# Patient Record
Sex: Male | Born: 1950
Health system: Southern US, Community
[De-identification: ages and names within clinical notes are randomized; demographics above are authoritative.]

## PROBLEM LIST (undated history)

## (undated) DIAGNOSIS — I10 Essential (primary) hypertension: Secondary | ICD-10-CM

## (undated) DIAGNOSIS — E785 Hyperlipidemia, unspecified: Secondary | ICD-10-CM

## (undated) HISTORY — DX: Hyperlipidemia, unspecified: E78.5

## (undated) HISTORY — DX: Essential (primary) hypertension: I10

---

## 2011-10-10 ENCOUNTER — Ambulatory Visit: Payer: Worker's Compensation | Attending: Orthopedic Surgery | Admitting: Physical Therapy

## 2011-10-10 DIAGNOSIS — R262 Difficulty in walking, not elsewhere classified: Secondary | ICD-10-CM | POA: Insufficient documentation

## 2011-10-10 DIAGNOSIS — IMO0001 Reserved for inherently not codable concepts without codable children: Secondary | ICD-10-CM | POA: Insufficient documentation

## 2011-10-10 DIAGNOSIS — R5381 Other malaise: Secondary | ICD-10-CM | POA: Insufficient documentation

## 2011-10-10 DIAGNOSIS — M25579 Pain in unspecified ankle and joints of unspecified foot: Secondary | ICD-10-CM | POA: Insufficient documentation

## 2011-10-12 ENCOUNTER — Ambulatory Visit: Payer: Worker's Compensation | Admitting: *Deleted

## 2011-10-16 ENCOUNTER — Ambulatory Visit: Payer: Worker's Compensation | Admitting: Physical Therapy

## 2011-10-18 ENCOUNTER — Ambulatory Visit: Payer: Worker's Compensation | Admitting: Physical Therapy

## 2011-10-23 ENCOUNTER — Ambulatory Visit: Payer: Worker's Compensation | Admitting: Physical Therapy

## 2011-10-26 ENCOUNTER — Ambulatory Visit: Payer: Worker's Compensation | Admitting: Physical Therapy

## 2011-10-31 ENCOUNTER — Ambulatory Visit: Payer: Worker's Compensation | Attending: Orthopedic Surgery | Admitting: *Deleted

## 2011-10-31 DIAGNOSIS — R262 Difficulty in walking, not elsewhere classified: Secondary | ICD-10-CM | POA: Insufficient documentation

## 2011-10-31 DIAGNOSIS — R5381 Other malaise: Secondary | ICD-10-CM | POA: Insufficient documentation

## 2011-10-31 DIAGNOSIS — IMO0001 Reserved for inherently not codable concepts without codable children: Secondary | ICD-10-CM | POA: Insufficient documentation

## 2011-10-31 DIAGNOSIS — M25579 Pain in unspecified ankle and joints of unspecified foot: Secondary | ICD-10-CM | POA: Insufficient documentation

## 2011-11-02 ENCOUNTER — Ambulatory Visit: Payer: Worker's Compensation | Admitting: *Deleted

## 2011-11-07 ENCOUNTER — Ambulatory Visit: Payer: Worker's Compensation | Admitting: *Deleted

## 2011-11-09 ENCOUNTER — Ambulatory Visit: Payer: Worker's Compensation | Admitting: Physical Therapy

## 2011-11-14 ENCOUNTER — Ambulatory Visit: Payer: Worker's Compensation | Admitting: Physical Therapy

## 2011-11-16 ENCOUNTER — Ambulatory Visit: Payer: Worker's Compensation | Admitting: Physical Therapy

## 2012-05-14 ENCOUNTER — Encounter: Payer: Self-pay | Admitting: Internal Medicine

## 2012-06-20 ENCOUNTER — Encounter: Payer: Worker's Compensation | Admitting: Internal Medicine

## 2012-06-25 ENCOUNTER — Encounter: Payer: Worker's Compensation | Admitting: Internal Medicine

## 2013-03-14 ENCOUNTER — Other Ambulatory Visit: Payer: Self-pay | Admitting: Family Medicine

## 2013-03-17 NOTE — Telephone Encounter (Signed)
LAST OV 7/13. LAST REFILL 12/11/12 FOR #90.

## 2013-04-15 ENCOUNTER — Ambulatory Visit (INDEPENDENT_AMBULATORY_CARE_PROVIDER_SITE_OTHER): Payer: BC Managed Care – PPO | Admitting: Physician Assistant

## 2013-04-15 ENCOUNTER — Encounter: Payer: Self-pay | Admitting: Physician Assistant

## 2013-04-15 VITALS — BP 153/84 | HR 65 | Temp 98.4°F | Ht 68.0 in | Wt 171.0 lb

## 2013-04-15 DIAGNOSIS — E785 Hyperlipidemia, unspecified: Secondary | ICD-10-CM | POA: Insufficient documentation

## 2013-04-15 DIAGNOSIS — I10 Essential (primary) hypertension: Secondary | ICD-10-CM | POA: Insufficient documentation

## 2013-04-15 DIAGNOSIS — Z0000 Encounter for general adult medical examination without abnormal findings: Secondary | ICD-10-CM

## 2013-04-15 LAB — BASIC METABOLIC PANEL WITH GFR
Chloride: 104 mEq/L (ref 96–112)
Creat: 0.87 mg/dL (ref 0.50–1.35)
GFR, Est Non African American: >89 mL/min
Potassium: 3.8 mEq/L (ref 3.5–5.3)
Sodium: 140 mEq/L (ref 135–145)

## 2013-04-15 LAB — HEPATIC FUNCTION PANEL
Albumin: 4.5 g/dL (ref 3.5–5.2)
Alkaline Phosphatase: 73 U/L (ref 39–117)
Total Bilirubin: 0.3 mg/dL (ref 0.3–1.2)

## 2013-04-15 LAB — LIPID PANEL
LDL Cholesterol: 124 mg/dL — ABNORMAL HIGH (ref 0–99)
VLDL: 30 mg/dL (ref 0–40)

## 2013-04-15 MED ORDER — SIMVASTATIN 10 MG PO TABS: 10.0000 mg | ORAL_TABLET | Freq: Every day | ORAL | Status: DC

## 2013-04-15 MED ORDER — AMLODIPINE BESYLATE 10 MG PO TABS: 10.0000 mg | ORAL_TABLET | Freq: Every day | ORAL | Status: DC

## 2013-04-15 NOTE — Progress Notes (Signed)
Subjective:     Patient ID: Daniel Baxter, male   DOB: 03-26-1951, 62 y.o.   MRN: [id number]  Hypertension This is a chronic problem. The current episode started more than 1 year ago. The problem has been resolved since onset. The problem is controlled. Risk factors for coronary artery disease include dyslipidemia, family history, male gender, smoking/tobacco exposure and sedentary lifestyle. Past treatments include ACE inhibitors and diuretics. The current treatment provides significant improvement. Compliance problems include diet and exercise.   Hyperlipidemia This is a chronic problem. The current episode started more than 1 year ago. The problem is controlled. Recent lipid tests were reviewed and are variable. Factors aggravating his hyperlipidemia include fatty foods and smoking. Current antihyperlipidemic treatment includes statins. The current treatment provides mild improvement of lipids. Compliance problems include adherence to exercise and adherence to diet.  Risk factors for coronary artery disease include dyslipidemia, family history, hypertension, male sex, obesity and a sedentary lifestyle.     Review of Systems  All other systems reviewed and are negative.       Objective:   Physical Exam  Constitutional: He is oriented to person, place, and time. He appears well-developed and well-nourished.  HENT:  Head: Normocephalic and atraumatic.  Right Ear: External ear normal.  Left Ear: External ear normal.  Nose: Nose normal.  Mouth/Throat: Oropharynx is clear and moist.  Eyes: Conjunctivae and EOM are normal. Pupils are equal, round, and reactive to light.  Neck: Normal range of motion. Neck supple. No JVD present. No tracheal deviation present.  Cardiovascular: Normal rate, regular rhythm and normal heart sounds.   Pulmonary/Chest: Effort normal and breath sounds normal. No respiratory distress. He has no wheezes. He has no rales.  Abdominal: Soft. Bowel sounds are normal. He  exhibits no distension and no mass. There is no tenderness. There is no rebound and no guarding.  Genitourinary: Penis normal.  No hernia  Musculoskeletal: Normal range of motion. He exhibits no edema and no tenderness.  Lymphadenopathy:    He has no cervical adenopathy.  Neurological: He is alert and oriented to person, place, and time. He has normal reflexes. No cranial nerve deficit.  Skin: Skin is warm and dry.  Psychiatric: He has a normal mood and affect.       Assessment:     Complete  HTN Hyperlipid    Plan:     Cont current meds Pt defers colonoscopy Labs pending- will inform of lab results Stressed importance of diet F/U 6 mos

## 2013-04-15 NOTE — Patient Instructions (Signed)

## 2013-04-16 ENCOUNTER — Telehealth: Payer: Self-pay | Admitting: Family Medicine

## 2013-04-16 NOTE — Telephone Encounter (Signed)
CALLED BACK NEVER MIND DON'T UP IT HE HAS BEEN OFF OF IT FOR AWHILE HE JUST WANTS YOU TO SEND HIS RX IN TO THE PHARMACY

## 2013-07-10 ENCOUNTER — Other Ambulatory Visit: Payer: Self-pay | Admitting: Physician Assistant

## 2013-11-29 ENCOUNTER — Other Ambulatory Visit: Payer: Self-pay | Admitting: Physician Assistant

## 2013-12-02 NOTE — Telephone Encounter (Signed)
Last seen 5/14 [facility]

## 2014-01-03 ENCOUNTER — Other Ambulatory Visit: Payer: Self-pay | Admitting: Family Medicine

## 2014-01-05 ENCOUNTER — Encounter: Payer: BC Managed Care – PPO | Admitting: *Deleted

## 2014-01-05 NOTE — Progress Notes (Signed)
This encounter was created in error - please disregard. Patient came in for vomiting and diarrhea but this had stopped as of yesterday. He had spoke with Dr. Christell ConstantMoore yesterday morning and he advised him to eat jello, clear liquids and try zantac and this seemed to help. Patient advised that if the symptoms come back to call and we will get him but to continue eating a bland diet today and see how he does

## 2014-01-06 NOTE — Telephone Encounter (Signed)
Last seen 5/14  [facility]

## 2014-01-06 NOTE — Telephone Encounter (Signed)
Patient aware, rx for BP medication filled for one month but will need to schedule an appointment.

## 2014-01-06 NOTE — Telephone Encounter (Signed)
NTBS.

## 2014-02-04 ENCOUNTER — Other Ambulatory Visit: Payer: Self-pay | Admitting: Nurse Practitioner

## 2014-03-08 ENCOUNTER — Other Ambulatory Visit: Payer: Self-pay | Admitting: Family Medicine

## 2014-04-08 ENCOUNTER — Telehealth: Payer: Self-pay | Admitting: Family Medicine

## 2014-04-08 MED ORDER — AMLODIPINE BESYLATE 10 MG PO TABS: ORAL_TABLET | ORAL | Status: DC

## 2014-04-08 NOTE — Telephone Encounter (Signed)
appt given for 6/11 and we refilled his bp for 1 month until his follow up appt

## 2014-05-04 ENCOUNTER — Other Ambulatory Visit: Payer: Self-pay | Admitting: Physician Assistant

## 2014-05-07 ENCOUNTER — Ambulatory Visit (INDEPENDENT_AMBULATORY_CARE_PROVIDER_SITE_OTHER): Payer: BC Managed Care – PPO | Admitting: Family

## 2014-05-07 ENCOUNTER — Encounter: Payer: Self-pay | Admitting: Family

## 2014-05-07 VITALS — BP 164/81 | HR 69 | Temp 98.2°F | Ht 68.0 in | Wt 175.8 lb

## 2014-05-07 DIAGNOSIS — Z0000 Encounter for general adult medical examination without abnormal findings: Secondary | ICD-10-CM

## 2014-05-07 DIAGNOSIS — I10 Essential (primary) hypertension: Secondary | ICD-10-CM

## 2014-05-07 DIAGNOSIS — E785 Hyperlipidemia, unspecified: Secondary | ICD-10-CM

## 2014-05-07 DIAGNOSIS — R599 Enlarged lymph nodes, unspecified: Secondary | ICD-10-CM

## 2014-05-07 MED ORDER — AMLODIPINE BESYLATE 10 MG PO TABS: ORAL_TABLET | ORAL | Status: DC

## 2014-05-07 NOTE — Patient Instructions (Signed)
Health Maintenance, Males A healthy lifestyle and preventative care can promote health and wellness.  Maintain regular health, dental, and eye exams.  Eat a healthy diet. Foods like vegetables, fruits, whole grains, low-fat dairy products, and lean protein foods contain the nutrients you need and are low in calories. Decrease your intake of foods high in solid fats, added sugars, and salt. Get information about a proper diet from your health care provider, if necessary.  Regular physical exercise is one of the most important things you can do for your health. Most adults should get at least 150 minutes of moderate-intensity exercise (any activity that increases your heart rate and causes you to sweat) each week. In addition, most adults need muscle-strengthening exercises on 2 or more days a week.   Maintain a healthy weight. The body mass index (BMI) is a screening tool to identify possible weight problems. It provides an estimate of body fat based on height and weight. Your health care provider can find your BMI and can help you achieve or maintain a healthy weight. For males 20 years and older:  A BMI below 18.5 is considered underweight.  A BMI of 18.5 to 24.9 is normal.  A BMI of 25 to 29.9 is considered overweight.  A BMI of 30 and above is considered obese.  Maintain normal blood lipids and cholesterol by exercising and minimizing your intake of saturated fat. Eat a balanced diet with plenty of fruits and vegetables. Blood tests for lipids and cholesterol should begin at age 20 and be repeated every 5 years. If your lipid or cholesterol levels are high, you are over 50, or you are at high risk for heart disease, you may need your cholesterol levels checked more frequently.Ongoing high lipid and cholesterol levels should be treated with medicines, if diet and exercise are not working.  If you smoke, find out from your health care provider how to quit. If you do not use tobacco, do not  start.  Lung cancer screening is recommended for adults aged 55 80 years who are at high risk for developing lung cancer because of a history of smoking. A yearly low-dose CT scan of the lungs is recommended for people who have at least a 30-pack-year history of smoking and are a current smoker or have quit within the past 15 years. A pack year of smoking is smoking an average of 1 pack of cigarettes a day for 1 year (for example, a 30-pack-year history of smoking could mean smoking 1 pack a day for 30 years or 2 packs a day for 15 years). Yearly screening should continue until the smoker has stopped smoking for at least 15 years. Yearly screening should be stopped for people who develop a health problem that would prevent them from having lung cancer treatment.  If you choose to drink alcohol, do not have more than 2 drinks per day. One drink is considered to be 12 oz (360 mL) of beer, 5 oz (150 mL) of wine, or 1.5 oz (45 mL) of liquor.  Avoid use of street drugs. Do not share needles with anyone. Ask for help if you need support or instructions about stopping the use of drugs.  High blood pressure causes heart disease and increases the risk of stroke. Blood pressure should be checked at least every 1 2 years. Ongoing high blood pressure should be treated with medicines if weight loss and exercise are not effective.  If you are 45 61 years old, ask your health   care provider if you should take aspirin to prevent heart disease.  Diabetes screening involves taking a blood sample to check your fasting blood sugar level. This should be done once every 3 years after age 45, if you are at a normal weight and without risk factors for diabetes. Testing should be considered at a younger age or be carried out more frequently if you are overweight and have at least 1 risk factor for diabetes.  Colorectal cancer can be detected and often prevented. Most routine colorectal cancer screening begins at the age of 50  and continues through age 75. However, your health care provider may recommend screening at an earlier age if you have risk factors for colon cancer. On a yearly basis, your health care provider may provide home test kits to check for hidden blood in the stool. A small camera at the end of a tube may be used to directly examine the colon (sigmoidoscopy or colonoscopy) to detect the earliest forms of colorectal cancer. Talk to your health care provider about this at age 50, when routine screening begins. A direct exam of the colon should be repeated every 5 10 years through age 75, unless early forms of pre-cancerous polyps or small growths are found.  People who are at an increased risk for hepatitis B should be screened for this virus. You are considered at high risk for hepatitis B if:  You were born in a country where hepatitis B occurs often. Talk with your health care provider about which countries are considered high-risk.  Your parents were born in a high-risk country and you have not received a shot to protect against hepatitis B (hepatitis B vaccine).  You have HIV or AIDS.  You use needles to inject street drugs.  You live with, or have sex with, someone who has hepatitis B.  You are a man who has sex with other men (MSM).  You get hemodialysis treatment.  You take certain medicines for conditions like cancer, organ transplantation, and autoimmune conditions.  Hepatitis C blood testing is recommended for all people born from 1945 through 1965 and any individual with known risk factors for hepatitis C.  Healthy men should no longer receive prostate-specific antigen (PSA) blood tests as part of routine cancer screening. Talk to your health care provider about prostate cancer screening.  Testicular cancer screening is not recommended for adolescents or adult males who have no symptoms. Screening includes self-exam, a health care provider exam, and other screening tests. Consult with  your health care provider about any symptoms you have or any concerns you have about testicular cancer.  Practice safe sex. Use condoms and avoid high-risk sexual practices to reduce the spread of sexually transmitted infections (STIs).  Use sunscreen. Apply sunscreen liberally and repeatedly throughout the day. You should seek shade when your shadow is shorter than you. Protect yourself by wearing long sleeves, pants, a wide-brimmed hat, and sunglasses year round, whenever you are outdoors.  Tell your health care provider of new moles or changes in moles, especially if there is a change in shape or color. Also tell your provider if a mole is larger than the size of a pencil eraser.  A one-time screening for abdominal aortic aneurysm (AAA) and surgical repair of large AAAs by ultrasound is recommended for men aged 65 75 years who are current or former smokers.  Stay current with your vaccines (immunizations). Document Released: 05/11/2008 Document Revised: 09/03/2013 Document Reviewed: 04/10/2011 [employer] Patient Information 2014 [employer].   Fat and Cholesterol Control Diet Fat and cholesterol levels in your blood and organs are influenced by your diet. High levels of fat and cholesterol may lead to diseases of the heart, small and large blood vessels, gallbladder, liver, and pancreas. CONTROLLING FAT AND CHOLESTEROL WITH DIET Although exercise and lifestyle factors are important, your diet is key. That is because certain foods are known to raise cholesterol and others to lower it. The goal is to balance foods for their effect on cholesterol and more importantly, to replace saturated and trans fat with other types of fat, such as monounsaturated fat, polyunsaturated fat, and omega-3 fatty acids. On average, a person should consume no more than 15 to 17 g of saturated fat daily. Saturated and trans fats are considered "bad" fats, and they will raise LDL cholesterol. Saturated fats are primarily  found in animal products such as meats, butter, and cream. However, that does not mean you need to give up all your favorite foods. Today, there are good tasting, low-fat, low-cholesterol substitutes for most of the things you like to eat. Choose low-fat or nonfat alternatives. Choose round or loin cuts of red meat. These types of cuts are lowest in fat and cholesterol. Chicken (without the skin), fish, veal, and ground [place] breast are great choices. Eliminate fatty meats, such as hot dogs and salami. Even shellfish have little or no saturated fat. Have a 3 oz (85 g) portion when you eat lean meat, poultry, or fish. Trans fats are also called "partially hydrogenated oils." They are oils that have been scientifically manipulated so that they are solid at room temperature resulting in a longer shelf life and improved taste and texture of foods in which they are added. Trans fats are found in stick margarine, some tub margarines, cookies, crackers, and baked goods.  When baking and cooking, oils are a great substitute for butter. The monounsaturated oils are especially beneficial since it is believed they lower LDL and raise HDL. The oils you should avoid entirely are saturated tropical oils, such as coconut and palm.  Remember to eat a lot from food groups that are naturally free of saturated and trans fat, including fish, fruit, vegetables, beans, grains (barley, rice, couscous, bulgur wheat), and pasta (without cream sauces).  IDENTIFYING FOODS THAT LOWER FAT AND CHOLESTEROL  Soluble fiber may lower your cholesterol. This type of fiber is found in fruits such as apples, vegetables such as broccoli, potatoes, and carrots, legumes such as beans, peas, and lentils, and grains such as barley. Foods fortified with plant sterols (phytosterol) may also lower cholesterol. You should eat at least 2 g per day of these foods for a cholesterol lowering effect.  Read package labels to identify low-saturated fats, trans  fat free, and low-fat foods at the supermarket. Select cheeses that have only 2 to 3 g saturated fat per ounce. Use a heart-healthy tub margarine that is free of trans fats or partially hydrogenated oil. When buying baked goods (cookies, crackers), avoid partially hydrogenated oils. Breads and muffins should be made from whole grains (whole-wheat or whole oat flour, instead of "flour" or "enriched flour"). Buy non-creamy canned soups with reduced salt and no added fats.  FOOD PREPARATION TECHNIQUES  Never deep-fry. If you must fry, either stir-fry, which uses very little fat, or use non-stick cooking sprays. When possible, broil, bake, or roast meats, and steam vegetables. Instead of putting butter or margarine on vegetables, use lemon and herbs, applesauce, and cinnamon (for squash and sweet potatoes). Use  nonfat yogurt, salsa, and low-fat dressings for salads.  LOW-SATURATED FAT / LOW-FAT FOOD SUBSTITUTES Meats / Saturated Fat (g)  Avoid: Steak, marbled (3 oz/85 g) / 11 g  Choose: Steak, lean (3 oz/85 g) / 4 g  Avoid: Hamburger (3 oz/85 g) / 7 g  Choose: Hamburger, lean (3 oz/85 g) / 5 g  Avoid: Ham (3 oz/85 g) / 6 g  Choose: Ham, lean cut (3 oz/85 g) / 2.4 g  Avoid: Chicken, with skin, dark meat (3 oz/85 g) / 4 g  Choose: Chicken, skin removed, dark meat (3 oz/85 g) / 2 g  Avoid: Chicken, with skin, light meat (3 oz/85 g) / 2.5 g  Choose: Chicken, skin removed, light meat (3 oz/85 g) / 1 g Dairy / Saturated Fat (g)  Avoid: Whole milk (1 cup) / 5 g  Choose: Low-fat milk, 2% (1 cup) / 3 g  Choose: Low-fat milk, 1% (1 cup) / 1.5 g  Choose: Skim milk (1 cup) / 0.3 g  Avoid: Hard cheese (1 oz/28 g) / 6 g  Choose: Skim milk cheese (1 oz/28 g) / 2 to 3 g  Avoid: Cottage cheese, 4% fat (1 cup) / 6.5 g  Choose: Low-fat cottage cheese, 1% fat (1 cup) / 1.5 g  Avoid: Ice cream (1 cup) / 9 g  Choose: Sherbet (1 cup) / 2.5 g  Choose: Nonfat frozen yogurt (1 cup) / 0.3  g  Choose: Frozen fruit bar / trace  Avoid: Whipped cream (1 tbs) / 3.5 g  Choose: Nondairy whipped topping (1 tbs) / 1 g Condiments / Saturated Fat (g)  Avoid: Mayonnaise (1 tbs) / 2 g  Choose: Low-fat mayonnaise (1 tbs) / 1 g  Avoid: Butter (1 tbs) / 7 g  Choose: Extra light margarine (1 tbs) / 1 g  Avoid: Coconut oil (1 tbs) / 11.8 g  Choose: Olive oil (1 tbs) / 1.8 g  Choose: Corn oil (1 tbs) / 1.7 g  Choose: Safflower oil (1 tbs) / 1.2 g  Choose: Sunflower oil (1 tbs) / 1.4 g  Choose: Soybean oil (1 tbs) / 2.4 g  Choose: Canola oil (1 tbs) / 1 g Document Released: 11/13/2005 Document Revised: 03/10/2013 Document Reviewed: 05/04/2011 Exit[employer] Patient Information 2014 [place], [place].

## 2014-05-07 NOTE — Progress Notes (Signed)
Subjective:    Patient ID: Daniel Baxter, male    DOB: Jan 28, 1951, 63 y.o.   MRN: [id number]  Hypertension This is a chronic problem. The current episode started more than 1 year ago. The problem has been waxing and waning since onset. The problem is uncontrolled. Pertinent negatives include no anxiety, headaches, palpitations, peripheral edema or shortness of breath. Risk factors for coronary artery disease include dyslipidemia and male gender. Past treatments include calcium channel blockers. Improvement on treatment: Pt has not taken medication in over month, he "ran out" There is no history of kidney disease, CAD/MI or a thyroid problem. There is no history of sleep apnea.  Hyperlipidemia This is a chronic problem. The current episode started more than 1 year ago. The problem is uncontrolled. Recent lipid tests were reviewed and are high. Pertinent negatives include no leg pain or shortness of breath. Current antihyperlipidemic treatment includes statins (Pt stop taking medication a "long time ago"). The current treatment provides no improvement of lipids. Compliance problems include medication cost.  Risk factors for coronary artery disease include dyslipidemia, hypertension and male sex.    *Pt states he "ran out of my blood pressure medications a month ago and I stopped taking my other medication a long time ago".   Review of Systems  HENT: Negative.   Respiratory: Negative.  Negative for shortness of breath.   Cardiovascular: Negative.  Negative for palpitations.  Gastrointestinal: Negative.   Genitourinary: Negative.   Musculoskeletal: Negative.   Neurological: Negative for headaches.  Hematological: Negative.   Psychiatric/Behavioral: Negative.   All other systems reviewed and are negative.      Objective:   Physical Exam  Vitals reviewed. Constitutional: He is oriented to person, place, and time. He appears well-developed and well-nourished. No distress.  HENT:  Head:  Normocephalic.  Right Ear: External ear normal.  Left Ear: External ear normal.  Mouth/Throat: Oropharynx is clear and moist.  Eyes: Pupils are equal, round, and reactive to light. Right eye exhibits no discharge. Left eye exhibits no discharge.  Neck: Normal range of motion. Neck supple.  Bilateral enlargement of cervical lymph nodes, hard and firm  Cardiovascular: Normal rate, regular rhythm, normal heart sounds and intact distal pulses.   No murmur heard. Pulmonary/Chest: Effort normal and breath sounds normal. No respiratory distress. He has no wheezes.  Abdominal: Soft. Bowel sounds are normal. He exhibits no distension. There is no tenderness.  Musculoskeletal: Normal range of motion. He exhibits no edema and no tenderness.  Neurological: He is alert and oriented to person, place, and time. He has normal reflexes. No cranial nerve deficit.  Skin: Skin is warm and dry. No rash noted. No erythema.  Psychiatric: He has a normal mood and affect. His behavior is normal. Judgment and thought content normal.    BP 164/81  Pulse 69  Temp(Src) 98.2 F (36.8 C) (Oral)  Ht _0  (1.727 m)  Wt 175 lb 12.8 oz (79.742 kg)  BMI 26.74 kg/m2       Assessment & Plan:  1. HTN (hypertension) - amLODipine (NORVASC) 10 MG tablet; TAKE 1 TABLET (10 MG TOTAL) BY MOUTH DAILY.  Dispense: 30 tablet; Refill: 11 - CMP14+EGFR  2. Hyperlipidemia -Pt has not been on medication for hyperlipidemia but states if labs come back high he will start on medicaiton - Lipid panel  3. Annual physical exam - Vit D  25 hydroxy (rtn osteoporosis monitoring) - PSA, total and free  4. Enlargement of lymph nodes - CBC  With differential/Platelet - Thyroid Panel With TSH   Continue all meds Labs pending Health Maintenance reviewed hemoccult cards given to patient with directions Diet and exercise encouraged RTO 1year  Evelina Dun, FNP    Evelina Dun, FNP

## 2014-05-08 ENCOUNTER — Other Ambulatory Visit (INDEPENDENT_AMBULATORY_CARE_PROVIDER_SITE_OTHER): Payer: BC Managed Care – PPO

## 2014-05-08 DIAGNOSIS — Z1212 Encounter for screening for malignant neoplasm of rectum: Secondary | ICD-10-CM

## 2014-05-08 LAB — CMP14+EGFR
ALT: 19 IU/L (ref 0–44)
AST: 17 IU/L (ref 0–40)
Albumin/Globulin Ratio: 1.7 (ref 1.1–2.5)
Albumin: 4.5 g/dL (ref 3.6–4.8)
Alkaline Phosphatase: 83 IU/L (ref 39–117)
BUN/Creatinine Ratio: 12 (ref 10–22)
BUN: 12 mg/dL (ref 8–27)
CALCIUM: 9.8 mg/dL (ref 8.6–10.2)
CO2: 25 mmol/L (ref 18–29)
CREATININE: 1 mg/dL (ref 0.76–1.27)
Chloride: 99 mmol/L (ref 97–108)
GFR calc Af Amer: 94 mL/min/{1.73_m2} (ref >59)
GFR calc non Af Amer: 81 mL/min/{1.73_m2} (ref >59)
GLOBULIN, TOTAL: 2.6 g/dL (ref 1.5–4.5)
Glucose: 98 mg/dL (ref 65–99)
Potassium: 4.3 mmol/L (ref 3.5–5.2)
Sodium: 139 mmol/L (ref 134–144)
TOTAL PROTEIN: 7.1 g/dL (ref 6.0–8.5)
Total Bilirubin: 0.3 mg/dL (ref 0.0–1.2)

## 2014-05-08 LAB — CBC WITH DIFFERENTIAL
BASOS ABS: 0.1 10*3/uL (ref 0.0–0.2)
BASOS: 1 %
Eos: 2 %
Eosinophils Absolute: 0.1 10*3/uL (ref 0.0–0.4)
HEMATOCRIT: 44.6 % (ref 37.5–51.0)
Hemoglobin: 15.2 g/dL (ref 12.6–17.7)
Immature Grans (Abs): 0 10*3/uL (ref 0.0–0.1)
Immature Granulocytes: 0 %
LYMPHS ABS: 1.8 10*3/uL (ref 0.7–3.1)
Lymphs: 26 %
MCH: 29.9 pg (ref 26.6–33.0)
MCHC: 34.1 g/dL (ref 31.5–35.7)
MCV: 88 fL (ref 79–97)
MONOCYTES: 6 %
Monocytes Absolute: 0.4 10*3/uL (ref 0.1–0.9)
NEUTROS ABS: 4.4 10*3/uL (ref 1.4–7.0)
Neutrophils Relative %: 65 %
Platelets: 312 10*3/uL (ref 150–379)
RBC: 5.09 x10E6/uL (ref 4.14–5.80)
RDW: 14.1 % (ref 12.3–15.4)
WBC: 6.8 10*3/uL (ref 3.4–10.8)

## 2014-05-08 LAB — LIPID PANEL
CHOL/HDL RATIO: 4.3 ratio (ref 0.0–5.0)
Cholesterol, Total: 197 mg/dL (ref 100–199)
HDL: 46 mg/dL (ref >39)
LDL Calculated: 131 mg/dL — ABNORMAL HIGH (ref 0–99)
Triglycerides: 102 mg/dL (ref 0–149)
VLDL Cholesterol Cal: 20 mg/dL (ref 5–40)

## 2014-05-08 LAB — PSA, TOTAL AND FREE
PSA, Free Pct: 19 %
PSA, Free: 0.19 ng/mL
PSA: 1 ng/mL (ref 0.0–4.0)

## 2014-05-08 LAB — THYROID PANEL WITH TSH
FREE THYROXINE INDEX: 2 (ref 1.2–4.9)
T3 UPTAKE RATIO: 28 % (ref 24–39)
T4 TOTAL: 7.1 ug/dL (ref 4.5–12.0)
TSH: 3.64 u[IU]/mL (ref 0.450–4.500)

## 2014-05-08 LAB — VITAMIN D 25 HYDROXY (VIT D DEFICIENCY, FRACTURES): VIT D 25 HYDROXY: 23.3 ng/mL — AB (ref 30.0–100.0)

## 2014-05-10 LAB — FECAL OCCULT BLOOD, IMMUNOCHEMICAL: FECAL OCCULT BLD: NEGATIVE

## 2014-06-03 ENCOUNTER — Telehealth: Payer: Self-pay | Admitting: Family Medicine

## 2014-06-03 MED ORDER — IBUPROFEN 800 MG PO TABS: 800.0000 mg | ORAL_TABLET | Freq: Three times a day (TID) | ORAL | Status: DC | PRN

## 2014-06-03 NOTE — Telephone Encounter (Signed)
Please call and find out how many pills a day he is taking and for what reason and remind him that the knees are not good for his kidney function and his stomach We will refill the prescription once we know the frequency of the dosage

## 2014-06-03 NOTE — Telephone Encounter (Signed)
Not on med list

## 2014-06-03 NOTE — Telephone Encounter (Signed)
Daniel Baxter had given pt Ibuprofen 800mg  for joint pain Per [facility] rx is okay for 1 refill

## 2015-05-24 ENCOUNTER — Other Ambulatory Visit: Payer: Self-pay | Admitting: Family

## 2015-06-07 ENCOUNTER — Encounter: Payer: Self-pay | Admitting: Family

## 2015-06-07 ENCOUNTER — Ambulatory Visit (INDEPENDENT_AMBULATORY_CARE_PROVIDER_SITE_OTHER): Payer: BLUE CROSS/BLUE SHIELD | Admitting: Family

## 2015-06-07 VITALS — BP 160/89 | HR 76 | Temp 98.5°F | Ht 68.0 in | Wt 174.0 lb

## 2015-06-07 DIAGNOSIS — I10 Essential (primary) hypertension: Secondary | ICD-10-CM | POA: Diagnosis not present

## 2015-06-07 DIAGNOSIS — E559 Vitamin D deficiency, unspecified: Secondary | ICD-10-CM

## 2015-06-07 DIAGNOSIS — Z125 Encounter for screening for malignant neoplasm of prostate: Secondary | ICD-10-CM | POA: Diagnosis not present

## 2015-06-07 DIAGNOSIS — E785 Hyperlipidemia, unspecified: Secondary | ICD-10-CM

## 2015-06-07 MED ORDER — AMLODIPINE BESYLATE 10 MG PO TABS: ORAL_TABLET | ORAL | Status: DC

## 2015-06-07 NOTE — Addendum Note (Signed)
Addended by: Orma RenderHODGES, Reniya Mcclees F on: 06/07/2015 04:27 PM   Modules accepted: Orders

## 2015-06-07 NOTE — Progress Notes (Signed)
Subjective:    Patient ID: Daniel Baxter, male    DOB: 01/04/1951, 64 y.o.   MRN: [id number]  Pt presents to the office today for chronic follow up. Pt states he has not taken his amlodipine in the last week because he "ran out" of his medications.  Hypertension This is a chronic problem. The current episode started more than 1 year ago. The problem has been waxing and waning since onset. The problem is uncontrolled. Pertinent negatives include no anxiety, headaches, palpitations, peripheral edema, PND or shortness of breath. Risk factors for coronary artery disease include dyslipidemia and male gender. Past treatments include calcium channel blockers (Has not had medication in last week). There is no history of kidney disease, CAD/MI, CVA, heart failure or a thyroid problem. There is no history of sleep apnea.  Hyperlipidemia This is a chronic problem. The current episode started more than 1 year ago. The problem is controlled. Recent lipid tests were reviewed and are normal. He has no history of diabetes. Pertinent negatives include no leg pain or shortness of breath. Current antihyperlipidemic treatment includes diet change. The current treatment provides moderate improvement of lipids. Risk factors for coronary artery disease include dyslipidemia and male sex.      Review of Systems  Constitutional: Negative.   HENT: Negative.   Respiratory: Negative.  Negative for shortness of breath.   Cardiovascular: Negative.  Negative for palpitations and PND.  Gastrointestinal: Negative.   Endocrine: Negative.   Genitourinary: Negative.   Musculoskeletal: Negative.   Neurological: Negative.  Negative for headaches.  Hematological: Negative.   Psychiatric/Behavioral: Negative.   All other systems reviewed and are negative.      Objective:   Physical Exam  Constitutional: He is oriented to person, place, and time. He appears well-developed and well-nourished. No distress.  HENT:  Head:  Normocephalic.  Right Ear: External ear normal.  Left Ear: External ear normal.  Mouth/Throat: Oropharynx is clear and moist.  Eyes: Pupils are equal, round, and reactive to light. Right eye exhibits no discharge. Left eye exhibits no discharge.  Neck: Normal range of motion. Neck supple. No thyromegaly present.  Cardiovascular: Normal rate, regular rhythm, normal heart sounds and intact distal pulses.   No murmur heard. Pulmonary/Chest: Effort normal and breath sounds normal. No respiratory distress. He has no wheezes.  Abdominal: Soft. Bowel sounds are normal. He exhibits no distension. There is no tenderness.  Musculoskeletal: Normal range of motion. He exhibits no edema or tenderness.  Neurological: He is alert and oriented to person, place, and time. He has normal reflexes. No cranial nerve deficit.  Skin: Skin is warm and dry. No rash noted. No erythema.  Psychiatric: He has a normal mood and affect. His behavior is normal. Judgment and thought content normal.  Vitals reviewed.    BP 160/89 mmHg  Pulse 76  Temp(Src) 98.5 F (36.9 C) (Oral)  Ht 5' 8"  (1.727 m)  Wt 174 lb (78.926 kg)  BMI 26.46 kg/m2      Assessment & Plan:  1. Hyperlipidemia - CMP14+EGFR - Lipid panel  2. Essential hypertension -Pt's Norvasc reordered- Pt "ran out" and has not taken in last week -Dash diet information given -Exercise encouraged - Stress Management  -Continue current meds -RTO in 2 weeks - CMP14+EGFR - amLODipine (NORVASC) 10 MG tablet; TAKE 1 TABLET (10 MG TOTAL) BY MOUTH DAILY.  Dispense: 30 tablet; Refill: 11  3. Prostate cancer screening - CMP14+EGFR - PSA Total+%Free (Serial)  4. Vitamin D deficiency -  CMP14+EGFR - Vit D  25 hydroxy (rtn osteoporosis monitoring)   Continue all meds Labs pending Health Maintenance reviewed Diet and exercise encouraged RTO 2 weeks to recheck HTN  Evelina Dun, FNP

## 2015-06-07 NOTE — Patient Instructions (Signed)
DASH Eating Plan DASH stands for "Dietary Approaches to Stop Hypertension." The DASH eating plan is a healthy eating plan that has been shown to reduce high blood pressure (hypertension). Additional health benefits may include reducing the risk of type 2 diabetes mellitus, heart disease, and stroke. The DASH eating plan may also help with weight loss. WHAT DO I NEED TO KNOW ABOUT THE DASH EATING PLAN? For the DASH eating plan, you will follow these general guidelines:  Choose foods with a percent daily value for sodium of less than 5% (as listed on the food label).  Use salt-free seasonings or herbs instead of table salt or sea salt.  Check with your health care provider or pharmacist before using salt substitutes.  Eat lower-sodium products, often labeled as "lower sodium" or "no salt added."  Eat fresh foods.  Eat more vegetables, fruits, and low-fat dairy products.  Choose whole grains. Look for the word "whole" as the first word in the ingredient list.  Choose fish and skinless chicken or turkey more often than red meat. Limit fish, poultry, and meat to 6 oz (170 g) each day.  Limit sweets, desserts, sugars, and sugary drinks.  Choose heart-healthy fats.  Limit cheese to 1 oz (28 g) per day.  Eat more home-cooked food and less restaurant, buffet, and fast food.  Limit fried foods.  Cook foods using methods other than frying.  Limit canned vegetables. If you do use them, rinse them well to decrease the sodium.  When eating at a restaurant, ask that your food be prepared with less salt, or no salt if possible. WHAT FOODS CAN I EAT? Seek help from a dietitian for individual calorie needs. Grains Whole grain or whole wheat bread. Brown rice. Whole grain or whole wheat pasta. Quinoa, bulgur, and whole grain cereals. Low-sodium cereals. Corn or whole wheat flour tortillas. Whole grain cornbread. Whole grain crackers. Low-sodium crackers. Vegetables Fresh or frozen vegetables  (raw, steamed, roasted, or grilled). Low-sodium or reduced-sodium tomato and vegetable juices. Low-sodium or reduced-sodium tomato sauce and paste. Low-sodium or reduced-sodium canned vegetables.  Fruits All fresh, canned (in natural juice), or frozen fruits. Meat and Other Protein Products Ground beef (85% or leaner), grass-fed beef, or beef trimmed of fat. Skinless chicken or turkey. Ground chicken or turkey. Pork trimmed of fat. All fish and seafood. Eggs. Dried beans, peas, or lentils. Unsalted nuts and seeds. Unsalted canned beans. Dairy Low-fat dairy products, such as skim or 1% milk, 2% or reduced-fat cheeses, low-fat ricotta or cottage cheese, or plain low-fat yogurt. Low-sodium or reduced-sodium cheeses. Fats and Oils Tub margarines without trans fats. Light or reduced-fat mayonnaise and salad dressings (reduced sodium). Avocado. Safflower, olive, or canola oils. Natural peanut or almond butter. Other Unsalted popcorn and pretzels. The items listed above may not be a complete list of recommended foods or beverages. Contact your dietitian for more options. WHAT FOODS ARE NOT RECOMMENDED? Grains White bread. White pasta. White rice. Refined cornbread. Bagels and croissants. Crackers that contain trans fat. Vegetables Creamed or fried vegetables. Vegetables in a cheese sauce. Regular canned vegetables. Regular canned tomato sauce and paste. Regular tomato and vegetable juices. Fruits Dried fruits. Canned fruit in light or heavy syrup. Fruit juice. Meat and Other Protein Products Fatty cuts of meat. Ribs, chicken wings, bacon, sausage, bologna, salami, chitterlings, fatback, hot dogs, bratwurst, and packaged luncheon meats. Salted nuts and seeds. Canned beans with salt. Dairy Whole or 2% milk, cream, half-and-half, and cream cheese. Whole-fat or sweetened yogurt. Full-fat   cheeses or blue cheese. Nondairy creamers and whipped toppings. Processed cheese, cheese spreads, or cheese  curds. Condiments Onion and garlic salt, seasoned salt, table salt, and sea salt. Canned and packaged gravies. Worcestershire sauce. Tartar sauce. Barbecue sauce. Teriyaki sauce. Soy sauce, including reduced sodium. Steak sauce. Fish sauce. Oyster sauce. Cocktail sauce. Horseradish. Ketchup and mustard. Meat flavorings and tenderizers. Bouillon cubes. Hot sauce. Tabasco sauce. Marinades. Taco seasonings. Relishes. Fats and Oils Butter, stick margarine, lard, shortening, ghee, and bacon fat. Coconut, palm kernel, or palm oils. Regular salad dressings. Other Pickles and olives. Salted popcorn and pretzels. The items listed above may not be a complete list of foods and beverages to avoid. Contact your dietitian for more information. WHERE CAN I FIND MORE INFORMATION? National Heart, Lung, and Blood Institute: www.nhlbi.nih.gov/health/health-topics/topics/dash/ Document Released: 11/02/2011 Document Revised: 03/30/2014 Document Reviewed: 09/17/2013 ExitCare Patient Information 2015 [employer]. This information is not intended to replace advice given to you by your health care provider. Make sure you discuss any questions you have with your health care provider. Hypertension Hypertension, commonly called high blood pressure, is when the force of blood pumping through your arteries is too strong. Your arteries are the blood vessels that carry blood from your heart throughout your body. A blood pressure reading consists of a higher number over a lower number, such as 110/72. The higher number (systolic) is the pressure inside your arteries when your heart pumps. The lower number (diastolic) is the pressure inside your arteries when your heart relaxes. Ideally you want your blood pressure below 120/80. Hypertension forces your heart to work harder to pump blood. Your arteries may become narrow or stiff. Having hypertension puts you at risk for heart disease, stroke, and other problems.  RISK  FACTORS Some risk factors for high blood pressure are controllable. Others are not.  Risk factors you cannot control include:   Race. You may be at higher risk if you are African American.  Age. Risk increases with age.  Gender. Men are at higher risk than women before age 45 years. After age 65, women are at higher risk than men. Risk factors you can control include:  Not getting enough exercise or physical activity.  Being overweight.  Getting too much fat, sugar, calories, or salt in your diet.  Drinking too much alcohol. SIGNS AND SYMPTOMS Hypertension does not usually cause signs or symptoms. Extremely high blood pressure (hypertensive crisis) may cause headache, anxiety, shortness of breath, and nosebleed. DIAGNOSIS  To check if you have hypertension, your health care provider will measure your blood pressure while you are seated, with your arm held at the level of your heart. It should be measured at least twice using the same arm. Certain conditions can cause a difference in blood pressure between your right and left arms. A blood pressure reading that is higher than normal on one occasion does not mean that you need treatment. If one blood pressure reading is high, ask your health care provider about having it checked again. TREATMENT  Treating high blood pressure includes making lifestyle changes and possibly taking medicine. Living a healthy lifestyle can help lower high blood pressure. You may need to change some of your habits. Lifestyle changes may include:  Following the DASH diet. This diet is high in fruits, vegetables, and whole grains. It is low in salt, red meat, and added sugars.  Getting at least 2 hours of brisk physical activity every week.  Losing weight if necessary.  Not smoking.  Limiting   alcoholic beverages.  Learning ways to reduce stress. If lifestyle changes are not enough to get your blood pressure under control, your health care provider may  prescribe medicine. You may need to take more than one. Work closely with your health care provider to understand the risks and benefits. HOME CARE INSTRUCTIONS  Have your blood pressure rechecked as directed by your health care provider.   Take medicines only as directed by your health care provider. Follow the directions carefully. Blood pressure medicines must be taken as prescribed. The medicine does not work as well when you skip doses. Skipping doses also puts you at risk for problems.   Do not smoke.   Monitor your blood pressure at home as directed by your health care provider. SEEK MEDICAL CARE IF:   You think you are having a reaction to medicines taken.  You have recurrent headaches or feel dizzy.  You have swelling in your ankles.  You have trouble with your vision. SEEK IMMEDIATE MEDICAL CARE IF:  You develop a severe headache or confusion.  You have unusual weakness, numbness, or feel faint.  You have severe chest or abdominal pain.  You vomit repeatedly.  You have trouble breathing. MAKE SURE YOU:   Understand these instructions.  Will watch your condition.  Will get help right away if you are not doing well or get worse. Document Released: 11/13/2005 Document Revised: 03/30/2014 Document Reviewed: 09/05/2013 ExitCare Patient Information 2015 [employer], [employer]. This information is not intended to replace advice given to you by your health care provider. Make sure you discuss any questions you have with your health care provider.  

## 2015-06-08 ENCOUNTER — Other Ambulatory Visit: Payer: Self-pay | Admitting: Family

## 2015-06-08 LAB — LIPID PANEL
CHOL/HDL RATIO: 4.9 ratio (ref 0.0–5.0)
CHOLESTEROL TOTAL: 207 mg/dL — AB (ref 100–199)
HDL: 42 mg/dL (ref >39)
LDL Calculated: 138 mg/dL — ABNORMAL HIGH (ref 0–99)
TRIGLYCERIDES: 137 mg/dL (ref 0–149)
VLDL Cholesterol Cal: 27 mg/dL (ref 5–40)

## 2015-06-08 LAB — CMP14+EGFR
ALBUMIN: 4.3 g/dL (ref 3.6–4.8)
ALK PHOS: 76 IU/L (ref 39–117)
ALT: 23 IU/L (ref 0–44)
AST: 16 IU/L (ref 0–40)
Albumin/Globulin Ratio: 1.4 (ref 1.1–2.5)
BUN/Creatinine Ratio: 12 (ref 10–22)
BUN: 12 mg/dL (ref 8–27)
Bilirubin Total: 0.2 mg/dL (ref 0.0–1.2)
CHLORIDE: 100 mmol/L (ref 97–108)
CO2: 24 mmol/L (ref 18–29)
Calcium: 9.7 mg/dL (ref 8.6–10.2)
Creatinine, Ser: 1.02 mg/dL (ref 0.76–1.27)
GFR, EST AFRICAN AMERICAN: 91 mL/min/{1.73_m2} (ref >59)
GFR, EST NON AFRICAN AMERICAN: 78 mL/min/{1.73_m2} (ref >59)
Globulin, Total: 3.1 g/dL (ref 1.5–4.5)
Glucose: 91 mg/dL (ref 65–99)
Potassium: 4 mmol/L (ref 3.5–5.2)
SODIUM: 140 mmol/L (ref 134–144)
TOTAL PROTEIN: 7.4 g/dL (ref 6.0–8.5)

## 2015-06-08 LAB — PSA, TOTAL AND FREE
PSA, Free Pct: 15.5 %
PSA, Free: 0.17 ng/mL
Prostate Specific Ag, Serum: 1.1 ng/mL (ref 0.0–4.0)

## 2015-06-08 LAB — VITAMIN D 25 HYDROXY (VIT D DEFICIENCY, FRACTURES): Vit D, 25-Hydroxy: 35.1 ng/mL (ref 30.0–100.0)

## 2015-06-08 MED ORDER — SIMVASTATIN 20 MG PO TABS: 20.0000 mg | ORAL_TABLET | Freq: Every day | ORAL | Status: DC

## 2015-06-21 ENCOUNTER — Ambulatory Visit: Payer: BLUE CROSS/BLUE SHIELD | Admitting: Family

## 2015-06-28 ENCOUNTER — Encounter: Payer: Self-pay | Admitting: Family

## 2015-06-28 ENCOUNTER — Ambulatory Visit (INDEPENDENT_AMBULATORY_CARE_PROVIDER_SITE_OTHER): Payer: BLUE CROSS/BLUE SHIELD | Admitting: Family

## 2015-06-28 VITALS — BP 162/78 | HR 65 | Temp 99.0°F | Ht 68.0 in | Wt 172.8 lb

## 2015-06-28 DIAGNOSIS — I10 Essential (primary) hypertension: Secondary | ICD-10-CM

## 2015-06-28 MED ORDER — LISINOPRIL-HYDROCHLOROTHIAZIDE 20-12.5 MG PO TABS: 1.0000 | ORAL_TABLET | Freq: Every day | ORAL | Status: DC

## 2015-06-28 MED ORDER — IBUPROFEN 800 MG PO TABS: 800.0000 mg | ORAL_TABLET | Freq: Three times a day (TID) | ORAL | Status: DC | PRN

## 2015-06-28 NOTE — Progress Notes (Signed)
   Subjective:    Patient ID: Daniel Baxter, male    DOB: 12-06-1950, 64 y.o.   MRN: [id number]  Pt presents to the office today to recheck HTN. Pt's BP is not at goal.  Hypertension This is a chronic problem. The current episode started more than 1 year ago. The problem has been waxing and waning since onset. The problem is uncontrolled. Pertinent negatives include no anxiety, headaches, palpitations, peripheral edema or shortness of breath. Past treatments include calcium channel blockers. The current treatment provides mild improvement. There is no history of kidney disease, CAD/MI, CVA or heart failure.      Review of Systems  Constitutional: Negative.   HENT: Negative.   Respiratory: Negative.  Negative for shortness of breath.   Cardiovascular: Negative.  Negative for palpitations.  Gastrointestinal: Negative.   Endocrine: Negative.   Genitourinary: Negative.   Musculoskeletal: Negative.   Neurological: Negative.  Negative for headaches.  Hematological: Negative.   Psychiatric/Behavioral: Negative.   All other systems reviewed and are negative.      Objective:   Physical Exam  Constitutional: He is oriented to person, place, and time. He appears well-developed and well-nourished. No distress.  HENT:  Head: Normocephalic.  Right Ear: External ear normal.  Left Ear: External ear normal.  Nose: Nose normal.  Mouth/Throat: Oropharynx is clear and moist.  Eyes: Pupils are equal, round, and reactive to light. Right eye exhibits no discharge. Left eye exhibits no discharge.  Neck: Normal range of motion. Neck supple. No thyromegaly present.  Cardiovascular: Normal rate, regular rhythm, normal heart sounds and intact distal pulses.   No murmur heard. Pulmonary/Chest: Effort normal and breath sounds normal. No respiratory distress. He has no wheezes.  Abdominal: Soft. Bowel sounds are normal. He exhibits no distension. There is no tenderness.  Musculoskeletal: Normal range of  motion. He exhibits no edema or tenderness.  Neurological: He is alert and oriented to person, place, and time. He has normal reflexes. No cranial nerve deficit.  Skin: Skin is warm and dry. No rash noted. No erythema.  Psychiatric: He has a normal mood and affect. His behavior is normal. Judgment and thought content normal.  Vitals reviewed.     BP 162/78 mmHg  Pulse 65  Temp(Src) 99 F (37.2 C) (Oral)  Ht _0  (1.727 m)  Wt 172 lb 12.8 oz (78.382 kg)  BMI 26.28 kg/m2     Assessment & Plan:  1. Essential hypertension --Daily blood pressure log given with instructions on how to fill out and told to bring to next visit -Dash diet information given -Exercise encouraged - Stress Management  -Continue current meds -RTO in 2 weeks - lisinopril-hydrochlorothiazide (ZESTORETIC) 20-12.5 MG per tablet; Take 1 tablet by mouth daily.  Dispense: 90 tablet; Refill: 3 - BMP8+EGFR   Evelina Dun, FNP

## 2015-06-28 NOTE — Patient Instructions (Signed)
DASH Eating Plan DASH stands for "Dietary Approaches to Stop Hypertension." The DASH eating plan is a healthy eating plan that has been shown to reduce high blood pressure (hypertension). Additional health benefits may include reducing the risk of type 2 diabetes mellitus, heart disease, and stroke. The DASH eating plan may also help with weight loss. WHAT DO I NEED TO KNOW ABOUT THE DASH EATING PLAN? For the DASH eating plan, you will follow these general guidelines:  Choose foods with a percent daily value for sodium of less than 5% (as listed on the food label).  Use salt-free seasonings or herbs instead of table salt or sea salt.  Check with your health care provider or pharmacist before using salt substitutes.  Eat lower-sodium products, often labeled as "lower sodium" or "no salt added."  Eat fresh foods.  Eat more vegetables, fruits, and low-fat dairy products.  Choose whole grains. Look for the word "whole" as the first word in the ingredient list.  Choose fish and skinless chicken or turkey more often than red meat. Limit fish, poultry, and meat to 6 oz (170 g) each day.  Limit sweets, desserts, sugars, and sugary drinks.  Choose heart-healthy fats.  Limit cheese to 1 oz (28 g) per day.  Eat more home-cooked food and less restaurant, buffet, and fast food.  Limit fried foods.  Cook foods using methods other than frying.  Limit canned vegetables. If you do use them, rinse them well to decrease the sodium.  When eating at a restaurant, ask that your food be prepared with less salt, or no salt if possible. WHAT FOODS CAN I EAT? Seek help from a dietitian for individual calorie needs. Grains Whole grain or whole wheat bread. Brown rice. Whole grain or whole wheat pasta. Quinoa, bulgur, and whole grain cereals. Low-sodium cereals. Corn or whole wheat flour tortillas. Whole grain cornbread. Whole grain crackers. Low-sodium crackers. Vegetables Fresh or frozen vegetables  (raw, steamed, roasted, or grilled). Low-sodium or reduced-sodium tomato and vegetable juices. Low-sodium or reduced-sodium tomato sauce and paste. Low-sodium or reduced-sodium canned vegetables.  Fruits All fresh, canned (in natural juice), or frozen fruits. Meat and Other Protein Products Ground beef (85% or leaner), grass-fed beef, or beef trimmed of fat. Skinless chicken or turkey. Ground chicken or turkey. Pork trimmed of fat. All fish and seafood. Eggs. Dried beans, peas, or lentils. Unsalted nuts and seeds. Unsalted canned beans. Dairy Low-fat dairy products, such as skim or 1% milk, 2% or reduced-fat cheeses, low-fat ricotta or cottage cheese, or plain low-fat yogurt. Low-sodium or reduced-sodium cheeses. Fats and Oils Tub margarines without trans fats. Light or reduced-fat mayonnaise and salad dressings (reduced sodium). Avocado. Safflower, olive, or canola oils. Natural peanut or almond butter. Other Unsalted popcorn and pretzels. The items listed above may not be a complete list of recommended foods or beverages. Contact your dietitian for more options. WHAT FOODS ARE NOT RECOMMENDED? Grains White bread. White pasta. White rice. Refined cornbread. Bagels and croissants. Crackers that contain trans fat. Vegetables Creamed or fried vegetables. Vegetables in a cheese sauce. Regular canned vegetables. Regular canned tomato sauce and paste. Regular tomato and vegetable juices. Fruits Dried fruits. Canned fruit in light or heavy syrup. Fruit juice. Meat and Other Protein Products Fatty cuts of meat. Ribs, chicken wings, bacon, sausage, bologna, salami, chitterlings, fatback, hot dogs, bratwurst, and packaged luncheon meats. Salted nuts and seeds. Canned beans with salt. Dairy Whole or 2% milk, cream, half-and-half, and cream cheese. Whole-fat or sweetened yogurt. Full-fat   cheeses or blue cheese. Nondairy creamers and whipped toppings. Processed cheese, cheese spreads, or cheese  curds. Condiments Onion and garlic salt, seasoned salt, table salt, and sea salt. Canned and packaged gravies. Worcestershire sauce. Tartar sauce. Barbecue sauce. Teriyaki sauce. Soy sauce, including reduced sodium. Steak sauce. Fish sauce. Oyster sauce. Cocktail sauce. Horseradish. Ketchup and mustard. Meat flavorings and tenderizers. Bouillon cubes. Hot sauce. Tabasco sauce. Marinades. Taco seasonings. Relishes. Fats and Oils Butter, stick margarine, lard, shortening, ghee, and bacon fat. Coconut, palm kernel, or palm oils. Regular salad dressings. Other Pickles and olives. Salted popcorn and pretzels. The items listed above may not be a complete list of foods and beverages to avoid. Contact your dietitian for more information. WHERE CAN I FIND MORE INFORMATION? National Heart, Lung, and Blood Institute: www.nhlbi.nih.gov/health/health-topics/topics/dash/ Document Released: 11/02/2011 Document Revised: 03/30/2014 Document Reviewed: 09/17/2013 ExitCare Patient Information 2015 [employer]. This information is not intended to replace advice given to you by your health care provider. Make sure you discuss any questions you have with your health care provider. Hypertension Hypertension, commonly called high blood pressure, is when the force of blood pumping through your arteries is too strong. Your arteries are the blood vessels that carry blood from your heart throughout your body. A blood pressure reading consists of a higher number over a lower number, such as 110/72. The higher number (systolic) is the pressure inside your arteries when your heart pumps. The lower number (diastolic) is the pressure inside your arteries when your heart relaxes. Ideally you want your blood pressure below 120/80. Hypertension forces your heart to work harder to pump blood. Your arteries may become narrow or stiff. Having hypertension puts you at risk for heart disease, stroke, and other problems.  RISK  FACTORS Some risk factors for high blood pressure are controllable. Others are not.  Risk factors you cannot control include:   Race. You may be at higher risk if you are African American.  Age. Risk increases with age.  Gender. Men are at higher risk than women before age 45 years. After age 65, women are at higher risk than men. Risk factors you can control include:  Not getting enough exercise or physical activity.  Being overweight.  Getting too much fat, sugar, calories, or salt in your diet.  Drinking too much alcohol. SIGNS AND SYMPTOMS Hypertension does not usually cause signs or symptoms. Extremely high blood pressure (hypertensive crisis) may cause headache, anxiety, shortness of breath, and nosebleed. DIAGNOSIS  To check if you have hypertension, your health care provider will measure your blood pressure while you are seated, with your arm held at the level of your heart. It should be measured at least twice using the same arm. Certain conditions can cause a difference in blood pressure between your right and left arms. A blood pressure reading that is higher than normal on one occasion does not mean that you need treatment. If one blood pressure reading is high, ask your health care provider about having it checked again. TREATMENT  Treating high blood pressure includes making lifestyle changes and possibly taking medicine. Living a healthy lifestyle can help lower high blood pressure. You may need to change some of your habits. Lifestyle changes may include:  Following the DASH diet. This diet is high in fruits, vegetables, and whole grains. It is low in salt, red meat, and added sugars.  Getting at least 2 hours of brisk physical activity every week.  Losing weight if necessary.  Not smoking.  Limiting   alcoholic beverages.  Learning ways to reduce stress. If lifestyle changes are not enough to get your blood pressure under control, your health care provider may  prescribe medicine. You may need to take more than one. Work closely with your health care provider to understand the risks and benefits. HOME CARE INSTRUCTIONS  Have your blood pressure rechecked as directed by your health care provider.   Take medicines only as directed by your health care provider. Follow the directions carefully. Blood pressure medicines must be taken as prescribed. The medicine does not work as well when you skip doses. Skipping doses also puts you at risk for problems.   Do not smoke.   Monitor your blood pressure at home as directed by your health care provider. SEEK MEDICAL CARE IF:   You think you are having a reaction to medicines taken.  You have recurrent headaches or feel dizzy.  You have swelling in your ankles.  You have trouble with your vision. SEEK IMMEDIATE MEDICAL CARE IF:  You develop a severe headache or confusion.  You have unusual weakness, numbness, or feel faint.  You have severe chest or abdominal pain.  You vomit repeatedly.  You have trouble breathing. MAKE SURE YOU:   Understand these instructions.  Will watch your condition.  Will get help right away if you are not doing well or get worse. Document Released: 11/13/2005 Document Revised: 03/30/2014 Document Reviewed: 09/05/2013 ExitCare Patient Information 2015 [employer], [employer]. This information is not intended to replace advice given to you by your health care provider. Make sure you discuss any questions you have with your health care provider.  

## 2015-06-29 LAB — BMP8+EGFR
BUN/Creatinine Ratio: 12 (ref 10–22)
BUN: 12 mg/dL (ref 8–27)
CALCIUM: 9.9 mg/dL (ref 8.6–10.2)
CHLORIDE: 99 mmol/L (ref 97–108)
CO2: 22 mmol/L (ref 18–29)
CREATININE: 1 mg/dL (ref 0.76–1.27)
GFR calc Af Amer: 93 mL/min/{1.73_m2} (ref >59)
GFR calc non Af Amer: 80 mL/min/{1.73_m2} (ref >59)
GLUCOSE: 96 mg/dL (ref 65–99)
Potassium: 4 mmol/L (ref 3.5–5.2)
Sodium: 139 mmol/L (ref 134–144)

## 2015-07-02 ENCOUNTER — Telehealth: Payer: Self-pay | Admitting: Family

## 2015-07-02 NOTE — Telephone Encounter (Signed)
Patient states his blood pressure is 123/66 with pulse of 73.  Patient is not having symptoms of lightheadednes, dizziness or weakness.  Patient aware to call back if starts to have any of these symptoms

## 2015-07-12 ENCOUNTER — Ambulatory Visit (INDEPENDENT_AMBULATORY_CARE_PROVIDER_SITE_OTHER): Payer: BLUE CROSS/BLUE SHIELD | Admitting: Family

## 2015-07-12 ENCOUNTER — Encounter: Payer: Self-pay | Admitting: Family

## 2015-07-12 VITALS — BP 138/72 | HR 67 | Temp 98.6°F | Ht 68.0 in | Wt 173.8 lb

## 2015-07-12 DIAGNOSIS — I10 Essential (primary) hypertension: Secondary | ICD-10-CM | POA: Diagnosis not present

## 2015-07-12 NOTE — Patient Instructions (Signed)

## 2015-07-12 NOTE — Progress Notes (Signed)
   Subjective:    Patient ID: Daniel Baxter, male    DOB: 05/02/1951, 64 y.o.   MRN: [id number]  Pt presents to the office today for HTN recheck. Pt's BP is at goal today.  Hypertension This is a chronic problem. The current episode started more than 1 year ago. The problem has been resolved since onset. The problem is controlled. Pertinent negatives include no anxiety, headaches, palpitations, peripheral edema or shortness of breath. Risk factors for coronary artery disease include dyslipidemia, male gender, sedentary lifestyle and stress. Past treatments include ACE inhibitors, calcium channel blockers and diuretics. The current treatment provides significant improvement. There is no history of kidney disease, CAD/MI, CVA, heart failure or a thyroid problem. There is no history of sleep apnea.      Review of Systems  Constitutional: Negative.   HENT: Negative.   Respiratory: Negative.  Negative for shortness of breath.   Cardiovascular: Negative for palpitations.  Gastrointestinal: Negative.   Endocrine: Negative.   Genitourinary: Negative.   Musculoskeletal: Negative.   Neurological: Negative.  Negative for headaches.  Hematological: Negative.   Psychiatric/Behavioral: Negative.   All other systems reviewed and are negative.      Objective:   Physical Exam  Constitutional: He is oriented to person, place, and time. He appears well-developed and well-nourished. No distress.  HENT:  Head: Normocephalic.  Right Ear: External ear normal.  Left Ear: External ear normal.  Nose: Nose normal.  Mouth/Throat: Oropharynx is clear and moist.  Eyes: Pupils are equal, round, and reactive to light. Right eye exhibits no discharge. Left eye exhibits no discharge.  Neck: Normal range of motion. Neck supple. No thyromegaly present.  Cardiovascular: Normal rate, regular rhythm, normal heart sounds and intact distal pulses.   No murmur heard. Pulmonary/Chest: Effort normal and breath sounds  normal. No respiratory distress. He has no wheezes.  Abdominal: Soft. Bowel sounds are normal. He exhibits no distension. There is no tenderness.  Musculoskeletal: Normal range of motion. He exhibits no edema or tenderness.  Neurological: He is alert and oriented to person, place, and time. He has normal reflexes. No cranial nerve deficit.  Skin: Skin is warm and dry. No rash noted. No erythema.  Psychiatric: He has a normal mood and affect. His behavior is normal. Judgment and thought content normal.  Vitals reviewed.    BP 138/72 mmHg  Pulse 67  Temp(Src) 98.6 F (37 C) (Oral)  Ht  (1.727 m)  Wt 173 lb 12.8 oz (78.835 kg)  BMI 26.43 kg/m2      Assessment & Plan:  1. Essential hypertension --Daily blood pressure log given with instructions on how to fill out and told to bring to next visit -Dash diet information given -Exercise encouraged - Stress Management  -Continue current meds -RTO in 1 year -BMP  Jannifer Rodney, FNP

## 2015-07-13 LAB — BMP8+EGFR
BUN/Creatinine Ratio: 16 (ref 10–22)
BUN: 14 mg/dL (ref 8–27)
CALCIUM: 9.9 mg/dL (ref 8.6–10.2)
CO2: 23 mmol/L (ref 18–29)
CREATININE: 0.88 mg/dL (ref 0.76–1.27)
Chloride: 102 mmol/L (ref 97–108)
GFR calc Af Amer: 106 mL/min/{1.73_m2} (ref >59)
GFR, EST NON AFRICAN AMERICAN: 92 mL/min/{1.73_m2} (ref >59)
Glucose: 81 mg/dL (ref 65–99)
Potassium: 4.5 mmol/L (ref 3.5–5.2)
Sodium: 142 mmol/L (ref 134–144)

## 2016-01-21 ENCOUNTER — Telehealth: Payer: Self-pay | Admitting: *Deleted

## 2016-01-21 NOTE — Telephone Encounter (Signed)
Talked with pt, he wants to use mail order

## 2016-01-21 NOTE — Telephone Encounter (Signed)
Pt currently on Amlodipine  qd, can they change to diltiazem 30 mg bid? His ins. Requires mail order, he will not do mail order and wants to pay cash. This is all Katie at [employer] could come up with. Please advice. Sending this to [place] and [address]

## 2016-01-21 NOTE — Telephone Encounter (Signed)
Neysa Bonito these are my thoughts:  How much is amlodipine at [facility]? It usually is not a very expensive medications (about $12 to $15 per 90 tablets).  I am concerned about compliance with tid dosing but if patient cannot afford amlodipine and he is agreeable to tid dosing then this change is OK.  Would recommend RTC in 2 weeks after starting diltiazem to check BP control.

## 2016-01-21 NOTE — Telephone Encounter (Signed)
Please ask patient if he can afford amlodipine 10 mg at [facility]? ( the $12-15)

## 2016-01-24 NOTE — Telephone Encounter (Signed)
What is patient's mail order pharmacy? I will then order pt's amlodipine 10 mg daily.

## 2016-01-24 NOTE — Telephone Encounter (Signed)
Patient states this is already taken care of.

## 2016-07-17 ENCOUNTER — Ambulatory Visit: Payer: BLUE CROSS/BLUE SHIELD | Admitting: Family

## 2016-12-18 ENCOUNTER — Ambulatory Visit (INDEPENDENT_AMBULATORY_CARE_PROVIDER_SITE_OTHER): Payer: BLUE CROSS/BLUE SHIELD | Admitting: Pediatrics

## 2016-12-18 ENCOUNTER — Encounter: Payer: Self-pay | Admitting: Pediatrics

## 2016-12-18 VITALS — BP 176/82 | HR 79 | Temp 98.6°F

## 2016-12-18 DIAGNOSIS — T783XXA Angioneurotic edema, initial encounter: Secondary | ICD-10-CM | POA: Diagnosis not present

## 2016-12-18 DIAGNOSIS — T7840XA Allergy, unspecified, initial encounter: Secondary | ICD-10-CM | POA: Diagnosis not present

## 2016-12-18 MED ORDER — EPINEPHRINE 0.3 MG/0.3ML IJ SOAJ: 0.3000 mg | Freq: Once | INTRAMUSCULAR | Status: DC

## 2016-12-18 NOTE — Addendum Note (Signed)
Addended by: Johna SheriffVINCENT, Britni Driscoll L on: 12/18/2016 09:21 PM   Modules accepted: Level of Service

## 2016-12-18 NOTE — Progress Notes (Addendum)
  Subjective:   Patient ID: Daniel Baxter, male    DOB: 02-20-1951, 66 y.o.   MRN: [id number] CC: tongue and lips swelling  HPI: Daniel Baxter is a 66 y.o. male presenting for tongue and lips swelling  Was at work at [employer] today, started having tongue swelling He says he has been taking his BP meds regularly every day, last appt was 1.5 yrs ago though. Takes amlodipine and lisinopril-HCTZ No recent changes in meds per pt No new foods, never had allergic rxn before, no known allergies Feeling fine now other than tongue swelling No itching, no trouble breathing Started about 3 hrs ago Not getting better so came in to be seen  Relevant past medical, surgical, family and social history reviewed. Allergies and medications reviewed and updated. History  Smoking Status  . Former Smoker  . Quit date: 10/16/2012  Smokeless Tobacco  . Never Used   ROS: Per HPI   Objective:    BP (!) 176/82   Pulse 79   Temp 98.6 F (37 C) (Oral)   Wt Readings from Last 3 Encounters:  07/12/15 173 lb 12.8 oz (78.8 kg)  06/28/15 172 lb 12.8 oz (78.4 kg)  06/07/15 174 lb (78.9 kg)    Gen: NAD, alert, cooperative with exam, NCAT EYES: EOMI, no conjunctival injection, or no icterus ENT: L side of tongue swollen, causing dysarthria. L lower lip slightly swollen CV: NRRR, normal S1/S2, no murmur, distal pulses 2+ b/l Resp: CTABL, no wheezes, normal WOB Ext: No edema, warm Neuro: Alert and oriented  Assessment & Plan:  Daniel Baxter was seen today for tongue and lips swelling.  Diagnoses and all orders for this visit:  Severe allergic reaction, initial encounter No new known exposures, allergy vs angioedema, on lisinopril as one of BP meds Epipen given in clinic, family taking straight to ED now, pt declined EMS Must follow up here in 2 days, stop lisinopril -     EPINEPHrine (EPI-PEN) injection 0.3 mg; Inject 0.3 mLs (0.3 mg total) into the muscle once.  Follow up plan: Two days Daniel Krasarol  Laken Rog, MD [facility]

## 2016-12-18 NOTE — Patient Instructions (Signed)
We gave you an epi pen while in the clinic. Go straight to the emergency room for further evaluation

## 2016-12-20 ENCOUNTER — Ambulatory Visit (INDEPENDENT_AMBULATORY_CARE_PROVIDER_SITE_OTHER): Payer: BLUE CROSS/BLUE SHIELD | Admitting: Pediatrics

## 2016-12-20 ENCOUNTER — Encounter: Payer: Self-pay | Admitting: Pediatrics

## 2016-12-20 VITALS — BP 152/79 | HR 75 | Temp 98.8°F | Ht 68.0 in | Wt 187.8 lb

## 2016-12-20 DIAGNOSIS — I10 Essential (primary) hypertension: Secondary | ICD-10-CM | POA: Diagnosis not present

## 2016-12-20 DIAGNOSIS — T783XXD Angioneurotic edema, subsequent encounter: Secondary | ICD-10-CM

## 2016-12-20 DIAGNOSIS — E785 Hyperlipidemia, unspecified: Secondary | ICD-10-CM | POA: Diagnosis not present

## 2016-12-20 MED ORDER — HYDROCHLOROTHIAZIDE 25 MG PO TABS
25.0000 mg | ORAL_TABLET | Freq: Every day | ORAL | 11 refills | Status: DC
Start: 2016-12-20 — End: 2017-06-20

## 2016-12-20 MED ORDER — AMLODIPINE BESYLATE 10 MG PO TABS: ORAL_TABLET | ORAL | 11 refills | Status: DC

## 2016-12-20 MED ORDER — PRAVASTATIN SODIUM 80 MG PO TABS
80.0000 mg | ORAL_TABLET | Freq: Every day | ORAL | 11 refills | Status: DC
Start: 2016-12-20 — End: 2017-06-20

## 2016-12-20 NOTE — Progress Notes (Signed)
  Subjective:   Patient ID: Daniel Baxter, male    DOB: 02/27/1951, 66 y.o.   MRN: [id number] CC: Follow-up (Allergic Reaction)  HPI: Daniel Baxter is a 66 y.o. male presenting for Follow-up (Allergic Reaction)  Had tongue swelling two days ago, received epi in clinic, steroids, benadryl, H2blockers in ED. Tongue swelling improved with treatment No new exposures, meds or known allergens Most likely lisinopril cause of swelling Pt feeling well now Takes medications as prescribed, has not been on HCTZ/lisinopril combined med since ED visit  Former smoker, around a lot of smokers at home  HLD: takes med regularly  HTN: takes meds as prescribed other than changes as above No chest pain, no SOB Has had some low back pain at end of day of working No headaches  Relevant past medical, surgical, family and social history reviewed. Allergies and medications reviewed and updated. History  Smoking Status  . Former Smoker  . Quit date: 10/16/2012  Smokeless Tobacco  . Never Used   ROS: Per HPI   Objective:    BP (!) 152/79   Pulse 75   Temp 98.8 F (37.1 C) (Oral)   Ht '5\' 8"'$  (1.727 m)   Wt 187 lb 12.8 oz (85.2 kg)   BMI 28.55 kg/m   Wt Readings from Last 3 Encounters:  12/20/16 187 lb 12.8 oz (85.2 kg)  07/12/15 173 lb 12.8 oz (78.8 kg)  06/28/15 172 lb 12.8 oz (78.4 kg)    Gen: NAD, alert, cooperative with exam, NCAT EYES: EOMI, no conjunctival injection, or no icterus ENT:  OP without erythema LYMPH: no cervical LAD CV: NRRR, normal S1/S2, no murmur, distal pulses 2+ b/l Resp: CTABL, no wheezes, normal WOB Abd: +BS, soft, NTND. no guarding or organomegaly Ext: No edema, warm Neuro: Alert and oriented, strength equal b/l UE and LE, coordination grossly normal MSK: normal muscle bulk  Assessment & Plan:  Kham was seen today for follow-up.  Diagnoses and all orders for this visit:  Hyperlipidemia, unspecified hyperlipidemia type Well controlled, Cont med -      pravastatin (PRAVACHOL) 80 MG tablet; Take 1 tablet (80 mg total) by mouth daily. -     Lipid panel  Essential hypertension Slightly elevated today, has been off of two meds Cont amlodipine, HCTZ Lisinopril added to allergy list Check BPs at home, can come by here, let me know if elevated -     amLODipine (NORVASC) 10 MG tablet; TAKE 1 TABLET (10 MG TOTAL) BY MOUTH DAILY. -     hydrochlorothiazide (HYDRODIURIL) 25 MG tablet; Take 1 tablet (25 mg total) by mouth daily. -     CMP14+EGFR  Angioedema, subsequent encounter Likely from lisinopril, added to allergy list  Follow up plan: 6 mo Assunta Found, MD [place]

## 2016-12-21 LAB — LIPID PANEL
CHOLESTEROL TOTAL: 153 mg/dL (ref 100–199)
Chol/HDL Ratio: 2.4 ratio units (ref 0.0–5.0)
HDL: 63 mg/dL (ref >39)
LDL CALC: 73 mg/dL (ref 0–99)
Triglycerides: 83 mg/dL (ref 0–149)
VLDL Cholesterol Cal: 17 mg/dL (ref 5–40)

## 2016-12-21 LAB — CMP14+EGFR
ALT: 31 IU/L (ref 0–44)
AST: 24 IU/L (ref 0–40)
Albumin/Globulin Ratio: 1.4 (ref 1.2–2.2)
Albumin: 4.2 g/dL (ref 3.6–4.8)
Alkaline Phosphatase: 63 IU/L (ref 39–117)
BUN/Creatinine Ratio: 14 (ref 10–24)
BUN: 15 mg/dL (ref 8–27)
Bilirubin Total: <0.2 mg/dL (ref 0.0–1.2)
CO2: 22 mmol/L (ref 18–29)
CREATININE: 1.11 mg/dL (ref 0.76–1.27)
Calcium: 9.1 mg/dL (ref 8.6–10.2)
Chloride: 101 mmol/L (ref 96–106)
GFR calc non Af Amer: 70 mL/min/{1.73_m2} (ref >59)
GFR, EST AFRICAN AMERICAN: 81 mL/min/{1.73_m2} (ref >59)
Globulin, Total: 3 g/dL (ref 1.5–4.5)
Glucose: 84 mg/dL (ref 65–99)
Potassium: 4.1 mmol/L (ref 3.5–5.2)
Sodium: 142 mmol/L (ref 134–144)
Total Protein: 7.2 g/dL (ref 6.0–8.5)

## 2017-01-10 ENCOUNTER — Other Ambulatory Visit: Payer: Self-pay | Admitting: Family

## 2017-01-10 DIAGNOSIS — I10 Essential (primary) hypertension: Secondary | ICD-10-CM

## 2017-03-22 ENCOUNTER — Ambulatory Visit (INDEPENDENT_AMBULATORY_CARE_PROVIDER_SITE_OTHER): Payer: Worker's Compensation

## 2017-03-22 ENCOUNTER — Ambulatory Visit (INDEPENDENT_AMBULATORY_CARE_PROVIDER_SITE_OTHER): Payer: Worker's Compensation | Admitting: Family Medicine

## 2017-03-22 ENCOUNTER — Encounter: Payer: Self-pay | Admitting: Family Medicine

## 2017-03-22 VITALS — BP 165/85 | HR 87 | Temp 98.6°F | Ht 68.0 in | Wt 186.0 lb

## 2017-03-22 DIAGNOSIS — M542 Cervicalgia: Secondary | ICD-10-CM | POA: Diagnosis not present

## 2017-03-22 DIAGNOSIS — M545 Low back pain, unspecified: Secondary | ICD-10-CM

## 2017-03-22 DIAGNOSIS — M25561 Pain in right knee: Secondary | ICD-10-CM

## 2017-03-22 MED ORDER — MELOXICAM 15 MG PO TABS: 15.0000 mg | ORAL_TABLET | Freq: Every day | ORAL | 0 refills | Status: DC

## 2017-03-22 NOTE — Progress Notes (Signed)
BP (!) 165/85   Pulse 87   Temp 98.6 F (37 C) (Oral)   Ht  (1.727 m)   Wt 186 lb (84.4 kg)   BMI 28.28 kg/m    Subjective:    Patient ID: Daniel Baxter, male    DOB: [birth year], 66 y.o.   MRN: [id number]  HPI: Drayce Tawil is a 66 y.o. male presenting on 03/22/2017 for Knee Pain (right knee; fell at work 03/03/2017); Back Pain (all over); and Headache   HPI Fall, worker's comp Patient is coming in today for a fall that he sustained at work which occurred on 03/03/2017. He works for [employer]. He says he cannot recall the specific mechanism and how he fall but he had tripped over some pallets that he is working with and fell and somehow ended up on his back on top of the pallets. He is having significant pain in his lower back that goes up into his neck causing a headache in the back of his head. He also has pain in his right knee but that is slightly improving. He has been using ibuprofen for it.  Relevant past medical, surgical, family and social history reviewed and updated as indicated. Interim medical history since our last visit reviewed. Allergies and medications reviewed and updated.  Review of Systems  Constitutional: Negative for chills and fever.  Respiratory: Negative for shortness of breath and wheezing.   Cardiovascular: Negative for chest pain and leg swelling.  Musculoskeletal: Positive for arthralgias, back pain, myalgias and neck pain. Negative for gait problem.  Skin: Negative for rash.  All other systems reviewed and are negative.   Per HPI unless specifically indicated above        Objective:    BP (!) 165/85   Pulse 87   Temp 98.6 F (37 C) (Oral)   Ht  (1.727 m)   Wt 186 lb (84.4 kg)   BMI 28.28 kg/m   Wt Readings from Last 3 Encounters:  03/22/17 186 lb (84.4 kg)  12/20/16 187 lb 12.8 oz (85.2 kg)  07/12/15 173 lb 12.8 oz (78.8 kg)    Physical Exam  Constitutional: He is oriented to person, place, and time. He appears  well-developed and well-nourished. No distress.  Eyes: Conjunctivae are normal. No scleral icterus.  Cardiovascular: Normal rate, regular rhythm, normal heart sounds and intact distal pulses.   No murmur heard. Pulmonary/Chest: Effort normal and breath sounds normal. No respiratory distress. He has no wheezes. He has no rales.  Musculoskeletal: Normal range of motion. He exhibits no edema.       Right knee: He exhibits normal range of motion, no swelling, no erythema, no LCL laxity, normal patellar mobility, normal meniscus and no MCL laxity. Tenderness (Medial joint line tenderness and patellar tenderness) found. Medial joint line tenderness noted.       Cervical back: He exhibits tenderness (Bilateral paraspinal tenderness extending up into the occipital region). He exhibits normal range of motion and no bony tenderness.       Lumbar back: He exhibits tenderness (Bilateral lumbar tenderness and midline tenderness, no radicular symptoms and negative straight leg raise) and bony tenderness. He exhibits normal range of motion and no swelling.  Neurological: He is alert and oriented to person, place, and time. Coordination normal.  Skin: Skin is warm and dry. No rash noted. He is not diaphoretic.  Psychiatric: He has a normal mood and affect. His behavior is normal.  Nursing note and vitals reviewed.  Right knee x-ray: No acute bony abnormalities, with final report radiologist  Lumbar x-ray: No acute bony abnormalities, with final report radiologist  Cervical spine x-ray: No acute bony abnormalities, with final report radiologist     Assessment & Plan:   Problem List Items Addressed This Visit    None    Visit Diagnoses    Acute pain of right knee    -  Primary   Relevant Orders   DG Knee 1-2 Views Right   Acute midline low back pain without sciatica       Relevant Medications   meloxicam (MOBIC) 15 MG tablet   Other Relevant Orders   DG Lumbar Spine 2-3 Views   Neck pain        Relevant Medications   meloxicam (MOBIC) 15 MG tablet   Other Relevant Orders   DG Cervical Spine Complete      Recommended no pushing or pulling anything greater than 20 pounds and no lifting anything greater than 10 pounds for the next 2 weeks. No repetitive overhead motions and alternate sitting and standing for the next 2 weeks.  Recommended stretching exercises and if not improved then may need to go to physical therapy.  Follow up plan: Return if symptoms worsen or fail to improve.  Counseling provided for all of the vaccine components Orders Placed This Encounter  Procedures  . DG Cervical Spine Complete  . DG Lumbar Spine 2-3 Views  . DG Knee 1-2 Views Right    Arville Care, MD [facility] 03/22/2017, 3:08 PM

## 2017-06-20 ENCOUNTER — Encounter: Payer: Self-pay | Admitting: Pediatrics

## 2017-06-20 ENCOUNTER — Ambulatory Visit (INDEPENDENT_AMBULATORY_CARE_PROVIDER_SITE_OTHER): Payer: BLUE CROSS/BLUE SHIELD | Admitting: Pediatrics

## 2017-06-20 VITALS — BP 147/80 | HR 73 | Temp 99.7°F | Ht 68.0 in | Wt 184.4 lb

## 2017-06-20 DIAGNOSIS — I10 Essential (primary) hypertension: Secondary | ICD-10-CM

## 2017-06-20 DIAGNOSIS — E785 Hyperlipidemia, unspecified: Secondary | ICD-10-CM

## 2017-06-20 MED ORDER — HYDROCHLOROTHIAZIDE 25 MG PO TABS: 25.0000 mg | ORAL_TABLET | Freq: Every day | ORAL | 5 refills | Status: DC

## 2017-06-20 MED ORDER — AMLODIPINE BESYLATE 10 MG PO TABS: ORAL_TABLET | ORAL | 5 refills | Status: DC

## 2017-06-20 MED ORDER — PRAVASTATIN SODIUM 80 MG PO TABS: 80.0000 mg | ORAL_TABLET | Freq: Every day | ORAL | 11 refills | Status: DC

## 2017-06-20 MED ORDER — CARVEDILOL 6.25 MG PO TABS: 6.2500 mg | ORAL_TABLET | Freq: Two times a day (BID) | ORAL | 5 refills | Status: DC

## 2017-06-20 NOTE — Progress Notes (Signed)
  Subjective:   Patient ID: Daniel Baxter, male    DOB: 07-16-1951, 66 y.o.   MRN: [id number] CC: Follow-up (6 month) med problems HPI: Daniel RiggDarrell Levings is a 66 y.o. male presenting for Follow-up (6 month)  HTN: Checks at home, usually 150s No HA, no CP, no SOB  HLD: taking med daily, no s/e  Smoking some when stressed Grandson has leukemia, mom with cancer  Relevant past medical, surgical, family and social history reviewed. Allergies and medications reviewed and updated. History  Smoking Status  . Former Smoker  . Quit date: 10/16/2012  Smokeless Tobacco  . Never Used   ROS: Per HPI   Objective:    BP (!) 147/80   Pulse 73   Temp 99.7 F (37.6 C) (Oral)   Ht 5\' 8"  (1.727 m)   Wt 184 lb 6.4 oz (83.6 kg)   BMI 28.04 kg/m   Wt Readings from Last 3 Encounters:  06/20/17 184 lb 6.4 oz (83.6 kg)  03/22/17 186 lb (84.4 kg)  12/20/16 187 lb 12.8 oz (85.2 kg)    Gen: NAD, alert, cooperative with exam, NCAT EYES: EOMI, no conjunctival injection, or no icterus ENT:  OP without erythema LYMPH: no cervical LAD CV: NRRR, normal S1/S2, no murmur, distal pulses 2+ b/l Resp: CTABL, no wheezes, normal WOB Ext: No edema, warm Neuro: Alert and oriented  Assessment & Plan:  [name] was seen today for follow-up.  Diagnoses and all orders for this visit:  Essential hypertension Elevated today, asymptomatic Cont HCTZ, amlodipine, start carvedilol Had angioedema likely due to ACE-I several months ago -     carvedilol (COREG) 6.25 MG tablet; Take 1 tablet (6.25 mg total) by mouth 2 (two) times daily with a meal. -     amLODipine (NORVASC) 10 MG tablet; TAKE 1 TABLET (10 MG TOTAL) BY MOUTH DAILY. -     hydrochlorothiazide (HYDRODIURIL) 25 MG tablet; Take 1 tablet (25 mg total) by mouth daily.  Hyperlipidemia, unspecified hyperlipidemia type Stable, cont below -     pravastatin (PRAVACHOL) 80 MG tablet; Take 1 tablet (80 mg total) by mouth daily.   Follow up plan: Return in  about 6 months (around 12/21/2017). Rex Krasarol Marylynne Keelin, MD [facility]

## 2017-06-20 NOTE — Patient Instructions (Signed)
Goal blood pressure top number 120-130 Heart rate over 60

## 2017-07-19 DIAGNOSIS — B880 Other acariasis: Secondary | ICD-10-CM | POA: Diagnosis not present

## 2017-07-26 ENCOUNTER — Ambulatory Visit (INDEPENDENT_AMBULATORY_CARE_PROVIDER_SITE_OTHER): Payer: BLUE CROSS/BLUE SHIELD | Admitting: *Deleted

## 2017-07-26 VITALS — BP 139/71 | HR 76

## 2017-07-26 DIAGNOSIS — I10 Essential (primary) hypertension: Secondary | ICD-10-CM

## 2017-07-26 NOTE — Progress Notes (Signed)
Pt in for BP check BP 139 71 P 76

## 2017-10-03 ENCOUNTER — Ambulatory Visit (INDEPENDENT_AMBULATORY_CARE_PROVIDER_SITE_OTHER): Payer: BLUE CROSS/BLUE SHIELD | Admitting: Pediatrics

## 2017-10-03 ENCOUNTER — Encounter: Payer: Self-pay | Admitting: Pediatrics

## 2017-10-03 VITALS — BP 139/80 | HR 74 | Temp 99.3°F | Ht 68.0 in | Wt 193.0 lb

## 2017-10-03 DIAGNOSIS — G629 Polyneuropathy, unspecified: Secondary | ICD-10-CM | POA: Insufficient documentation

## 2017-10-03 DIAGNOSIS — I10 Essential (primary) hypertension: Secondary | ICD-10-CM | POA: Diagnosis not present

## 2017-10-03 DIAGNOSIS — Z1159 Encounter for screening for other viral diseases: Secondary | ICD-10-CM | POA: Diagnosis not present

## 2017-10-03 LAB — BAYER DCA HB A1C WAIVED: HB A1C: 5.6 % (ref <7.0)

## 2017-10-03 MED ORDER — CARVEDILOL 12.5 MG PO TABS: 12.5000 mg | ORAL_TABLET | Freq: Two times a day (BID) | ORAL | 2 refills | Status: DC

## 2017-10-03 MED ORDER — HYDROCHLOROTHIAZIDE 12.5 MG PO TABS: 12.5000 mg | ORAL_TABLET | Freq: Every day | ORAL | 3 refills | Status: DC

## 2017-10-03 MED ORDER — GABAPENTIN 300 MG PO CAPS: 300.0000 mg | ORAL_CAPSULE | Freq: Two times a day (BID) | ORAL | 2 refills | Status: DC

## 2017-10-03 NOTE — Progress Notes (Signed)
  Subjective:   Patient ID: Daniel Baxter, male    DOB: 1951/01/21, 66 y.o.   MRN: [id number] CC: Rash (Bilateral Feet and Ankles)  HPI: Daniel Baxter is a 66 y.o. male presenting for Rash (Bilateral Feet and Ankles)  Seen 2 mo ago for itchy rash somewhere in [place] Given betamethasone topical ointment Helped some with itching and rash, not as red anymore Still with some rash isnt sure if he has had any new spots Denies any vesicles B/l ankles and dorsum of feet affect, R>L Says he was told it is chiggers Still with sharp pains that feel like they go through his feet Resting helps Bothers him when he is sitting on sofa/in bed the most Also bothers him at work when he is walking a lot Has not been able to work for the past two months due to pain in feet  Drinks 4-5 beers a day Not interested in decreasing  Smoking daily, not interested in quitting  HTN: taking meds daily No CP, no SOB  Relevant past medical, surgical, family and social history reviewed. Allergies and medications reviewed and updated. Social History   Tobacco Use  Smoking Status Former Smoker  . Last attempt to quit: 10/16/2012  . Years since quitting: 4.9  Smokeless Tobacco Never Used   ROS: Per HPI   Objective:    BP 139/80   Pulse 74   Temp 99.3 F (37.4 C) (Oral)   Ht _0  (1.727 m)   Wt 193 lb (87.5 kg)   BMI 29.35 kg/m   Wt Readings from Last 3 Encounters:  10/03/17 193 lb (87.5 kg)  06/20/17 184 lb 6.4 oz (83.6 kg)  03/22/17 186 lb (84.4 kg)    Gen: NAD, alert, cooperative with exam, NCAT EYES: EOMI, no conjunctival injection, or no icterus ENT: OP without erythema LYMPH: no cervical LAD CV: NRRR, normal S1/S2, no murmur, distal pulses 2+ b/l Resp: CTABL, no wheezes, normal WOB Abd: +BS, soft, NT, mildly distended.  Ext: No edema, warm Neuro: Alert and oriented, strength equal b/l UE and LE, coordination grossly normal MSK: normal muscle bulk Skin: red, blanching papules  with some excoriations scattered over R and L ankles  Assessment & Plan:  Rein was seen today for rash, foot pain.  Diagnoses and all orders for this visit:  Neuropathy eval below labs, pt drinking heavily, not interested in quitting Trial of gabapentin -     Bayer DCA Hb A1c Waived -     CMP14+EGFR -     CBC with Differential/Platelet -     TSH -     Folate -     Vitamin B12 -     gabapentin (NEURONTIN) 300 MG capsule; Take 1 capsule (300 mg total) 2 (two) times daily by mouth.  Essential hypertension Stable, cont below -     carvedilol (COREG) 12.5 MG tablet; Take 1 tablet (12.5 mg total) 2 (two) times daily with a meal by mouth. -     hydrochlorothiazide (HYDRODIURIL) 12.5 MG tablet; Take 1 tablet (12.5 mg total) daily by mouth.  Need for hepatitis C screening test -     Hepatitis C antibody  Follow up plan: Return in about 4 weeks (around 10/31/2017). Assunta Found, MD [place]

## 2017-10-04 LAB — CBC WITH DIFFERENTIAL/PLATELET
BASOS ABS: 0.1 10*3/uL (ref 0.0–0.2)
Basos: 1 %
EOS (ABSOLUTE): 0.2 10*3/uL (ref 0.0–0.4)
EOS: 2 %
HEMOGLOBIN: 14.8 g/dL (ref 13.0–17.7)
Hematocrit: 41.6 % (ref 37.5–51.0)
IMMATURE GRANULOCYTES: 0 %
Immature Grans (Abs): 0 10*3/uL (ref 0.0–0.1)
LYMPHS ABS: 2.3 10*3/uL (ref 0.7–3.1)
LYMPHS: 27 %
MCH: 32.2 pg (ref 26.6–33.0)
MCHC: 35.6 g/dL (ref 31.5–35.7)
MCV: 90 fL (ref 79–97)
MONOCYTES: 8 %
Monocytes Absolute: 0.7 10*3/uL (ref 0.1–0.9)
Neutrophils Absolute: 5.1 10*3/uL (ref 1.4–7.0)
Neutrophils: 62 %
Platelets: 298 10*3/uL (ref 150–379)
RBC: 4.6 x10E6/uL (ref 4.14–5.80)
RDW: 15 % (ref 12.3–15.4)
WBC: 8.3 10*3/uL (ref 3.4–10.8)

## 2017-10-04 LAB — CMP14+EGFR
ALK PHOS: 73 IU/L (ref 39–117)
ALT: 49 IU/L — AB (ref 0–44)
AST: 40 IU/L (ref 0–40)
Albumin/Globulin Ratio: 1.4 (ref 1.2–2.2)
Albumin: 4.2 g/dL (ref 3.6–4.8)
BILIRUBIN TOTAL: 0.4 mg/dL (ref 0.0–1.2)
BUN / CREAT RATIO: 11 (ref 10–24)
BUN: 11 mg/dL (ref 8–27)
CHLORIDE: 94 mmol/L — AB (ref 96–106)
CO2: 26 mmol/L (ref 20–29)
CREATININE: 0.96 mg/dL (ref 0.76–1.27)
Calcium: 9.6 mg/dL (ref 8.6–10.2)
GFR calc Af Amer: 96 mL/min/{1.73_m2} (ref >59)
GFR calc non Af Amer: 83 mL/min/{1.73_m2} (ref >59)
GLUCOSE: 106 mg/dL — AB (ref 65–99)
Globulin, Total: 3.1 g/dL (ref 1.5–4.5)
Potassium: 3.3 mmol/L — ABNORMAL LOW (ref 3.5–5.2)
Sodium: 138 mmol/L (ref 134–144)
Total Protein: 7.3 g/dL (ref 6.0–8.5)

## 2017-10-04 LAB — TSH: TSH: 3.38 u[IU]/mL (ref 0.450–4.500)

## 2017-10-04 LAB — FOLATE: FOLATE: 12 ng/mL (ref >3.0)

## 2017-10-04 LAB — HEPATITIS C ANTIBODY: Hep C Virus Ab: <0.1 s/co ratio (ref 0.0–0.9)

## 2017-10-04 LAB — VITAMIN B12: VITAMIN B 12: 216 pg/mL — AB (ref 232–1245)

## 2017-10-16 ENCOUNTER — Encounter: Payer: Self-pay | Admitting: *Deleted

## 2017-10-16 DIAGNOSIS — Z029 Encounter for administrative examinations, unspecified: Secondary | ICD-10-CM

## 2017-10-31 ENCOUNTER — Ambulatory Visit: Payer: Self-pay | Admitting: Pediatrics

## 2017-11-08 ENCOUNTER — Ambulatory Visit: Payer: Self-pay | Admitting: Pediatrics

## 2017-11-13 ENCOUNTER — Ambulatory Visit: Payer: Self-pay | Admitting: Pediatrics

## 2017-11-21 ENCOUNTER — Encounter: Payer: Self-pay | Admitting: Pediatrics

## 2017-11-29 ENCOUNTER — Other Ambulatory Visit: Payer: Self-pay | Admitting: Pediatrics

## 2017-12-04 ENCOUNTER — Other Ambulatory Visit: Payer: Self-pay | Admitting: *Deleted

## 2017-12-04 DIAGNOSIS — E785 Hyperlipidemia, unspecified: Secondary | ICD-10-CM

## 2017-12-04 MED ORDER — PRAVASTATIN SODIUM 80 MG PO TABS: 80.0000 mg | ORAL_TABLET | Freq: Every day | ORAL | 0 refills | Status: DC

## 2017-12-04 NOTE — Telephone Encounter (Signed)
OV 12/20/17 

## 2017-12-20 ENCOUNTER — Ambulatory Visit: Payer: Self-pay | Admitting: Pediatrics

## 2018-04-03 ENCOUNTER — Other Ambulatory Visit: Payer: Self-pay | Admitting: Pediatrics

## 2018-04-03 DIAGNOSIS — I10 Essential (primary) hypertension: Secondary | ICD-10-CM

## 2018-05-01 ENCOUNTER — Other Ambulatory Visit: Payer: Self-pay | Admitting: Family Medicine

## 2018-05-01 DIAGNOSIS — I10 Essential (primary) hypertension: Secondary | ICD-10-CM

## 2018-05-01 NOTE — Telephone Encounter (Signed)
Last seen 10/03/17   Dr Oswaldo DoneVincent

## 2018-05-27 ENCOUNTER — Telehealth: Payer: Self-pay | Admitting: Pediatrics

## 2018-05-27 DIAGNOSIS — I10 Essential (primary) hypertension: Secondary | ICD-10-CM

## 2018-05-27 MED ORDER — HYDROCHLOROTHIAZIDE 12.5 MG PO TABS
12.5000 mg | ORAL_TABLET | Freq: Every day | ORAL | 0 refills | Status: DC
Start: 2018-05-27 — End: 2018-06-03

## 2018-05-27 MED ORDER — AMLODIPINE BESYLATE 10 MG PO TABS: 10.0000 mg | ORAL_TABLET | Freq: Every day | ORAL | 0 refills | Status: DC

## 2018-05-27 MED ORDER — CARVEDILOL 12.5 MG PO TABS: 12.5000 mg | ORAL_TABLET | Freq: Two times a day (BID) | ORAL | 0 refills | Status: DC

## 2018-05-27 NOTE — Telephone Encounter (Signed)
4 day supply sent to pharmacy per patient request.

## 2018-06-03 ENCOUNTER — Ambulatory Visit (INDEPENDENT_AMBULATORY_CARE_PROVIDER_SITE_OTHER): Payer: Medicare HMO | Admitting: Pediatrics

## 2018-06-03 ENCOUNTER — Encounter: Payer: Self-pay | Admitting: Pediatrics

## 2018-06-03 DIAGNOSIS — I10 Essential (primary) hypertension: Secondary | ICD-10-CM | POA: Diagnosis not present

## 2018-06-03 DIAGNOSIS — E785 Hyperlipidemia, unspecified: Secondary | ICD-10-CM | POA: Diagnosis not present

## 2018-06-03 MED ORDER — AMLODIPINE BESYLATE 10 MG PO TABS: 10.0000 mg | ORAL_TABLET | Freq: Every day | ORAL | 1 refills | Status: DC

## 2018-06-03 MED ORDER — CHLORTHALIDONE 25 MG PO TABS
25.0000 mg | ORAL_TABLET | Freq: Every day | ORAL | 2 refills | Status: DC
Start: 2018-06-03 — End: 2018-06-20

## 2018-06-03 MED ORDER — PRAVASTATIN SODIUM 80 MG PO TABS: 80.0000 mg | ORAL_TABLET | Freq: Every day | ORAL | 1 refills | Status: DC

## 2018-06-03 NOTE — Patient Instructions (Signed)
Check blood pressures at home.  Let me know if regularly greater than 140.

## 2018-06-03 NOTE — Progress Notes (Signed)
  Subjective:   Patient ID: Claiborne RiggDarrell Muchmore, male    DOB: 10-12-1951, 67 y.o.   MRN: [id number] CC: Medical Management of Chronic Issues  HPI: Claiborne RiggDarrell Fausto is a 67 y.o. male   Hypertension: Stopped taking carvedilol and hydrochlorothiazide a few months after last visit because he was having lightheadedness.  He has continued to take his cholesterol medicine and amlodipine.  Lost insurance but now has it back.  Checks his blood pressure at home, systolics usually in the 150s.  Hyperlipidemia: No side effects from statin  When he walks regularly the swelling in his feet is better.  He is no longer working at [employer], when he was working and standing on his feet for long periods of time the swelling was worse.  Relevant past medical, surgical, family and social history reviewed. Allergies and medications reviewed and updated. Social History   Tobacco Use  Smoking Status Former Smoker  . Last attempt to quit: 10/16/2012  . Years since quitting: 5.6  Smokeless Tobacco Never Used   ROS: Per HPI   Objective:    BP (!) 160/84   Pulse 79   Temp 98.7 F (37.1 C) (Oral)   Ht 5\' 8"  (1.727 m)   Wt 202 lb (91.6 kg)   BMI 30.71 kg/m   Wt Readings from Last 3 Encounters:  06/03/18 202 lb (91.6 kg)  10/03/17 193 lb (87.5 kg)  06/20/17 184 lb 6.4 oz (83.6 kg)    Gen: NAD, alert, cooperative with exam, NCAT EYES: EOMI, no conjunctival injection, or no icterus ENT:  TMs pearly gray b/l, OP without erythema LYMPH: no cervical LAD CV: NRRR, normal S1/S2, no murmur, distal pulses 2+ b/l Resp: CTABL, no wheezes, normal WOB Abd: +BS, soft, NTND. no guarding or organomegaly Ext: 1+ pitting edema bilateral ankles, warm Neuro: Alert and oriented, strength equal b/l UE and LE, coordination grossly normal MSK: normal muscle bulk  Assessment & Plan:  [name] was seen today for medical management of chronic issues.  Diagnoses and all orders for this visit:  Essential  hypertension Uncontrolled.  We will add on chlorthalidone.  Continue amlodipine.  Continue to check blood pressures at home.  Return in 2 to 3 weeks for blood pressure follow-up, will need blood work at that time. -     amLODipine (NORVASC) 10 MG tablet; Take 1 tablet (10 mg total) by mouth daily. -     chlorthalidone (HYGROTON) 25 MG tablet; Take 1 tablet (25 mg total) by mouth daily.  Hyperlipidemia, unspecified hyperlipidemia type Stable, continue below -     pravastatin (PRAVACHOL) 80 MG tablet; Take 1 tablet (80 mg total) by mouth daily.   Follow up plan: Return in about 3 weeks (around 06/24/2018). Rex Krasarol Drew Herman, MD [facility]

## 2018-06-17 ENCOUNTER — Ambulatory Visit: Payer: Self-pay | Admitting: Pediatrics

## 2018-06-18 IMAGING — DX DG CERVICAL SPINE COMPLETE 4+V
6 series · 6 of 6 positions shown · non-contrast
Comparison: None.

CLINICAL DATA: 65-year-old who fell over a pallet while at work.
Neck pain, low back pain and right knee pain. Initial encounter.

EXAM:
CERVICAL SPINE - COMPLETE 4+ VIEW

[c-spine lat (1 of 2)]
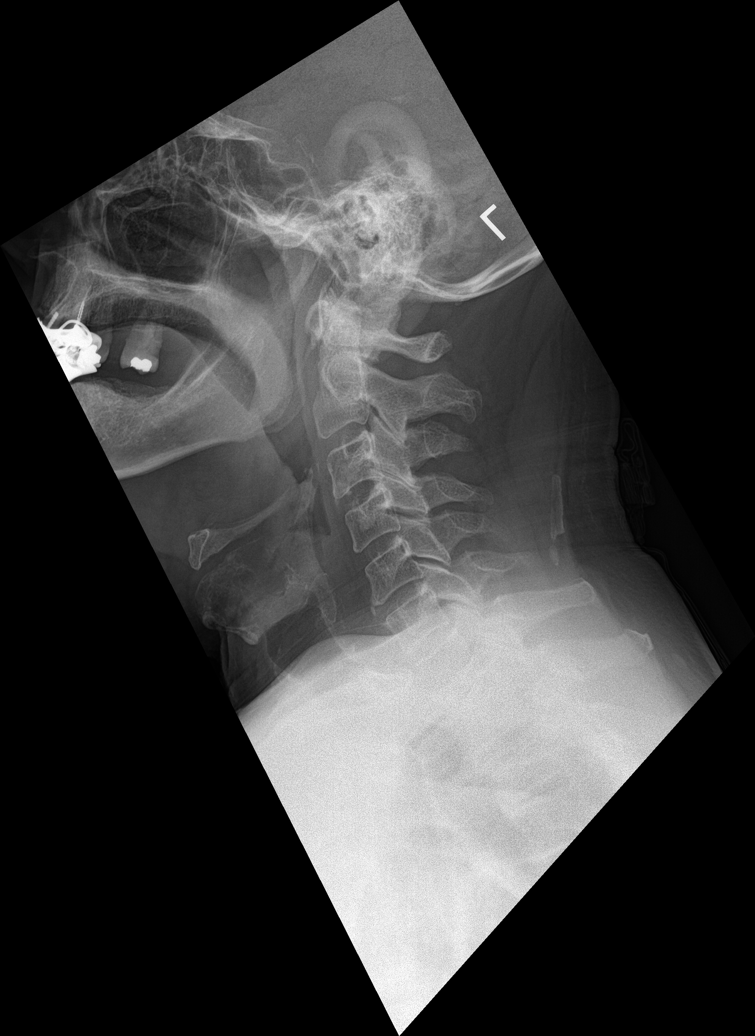

[c-spine obl (1 of 2)]
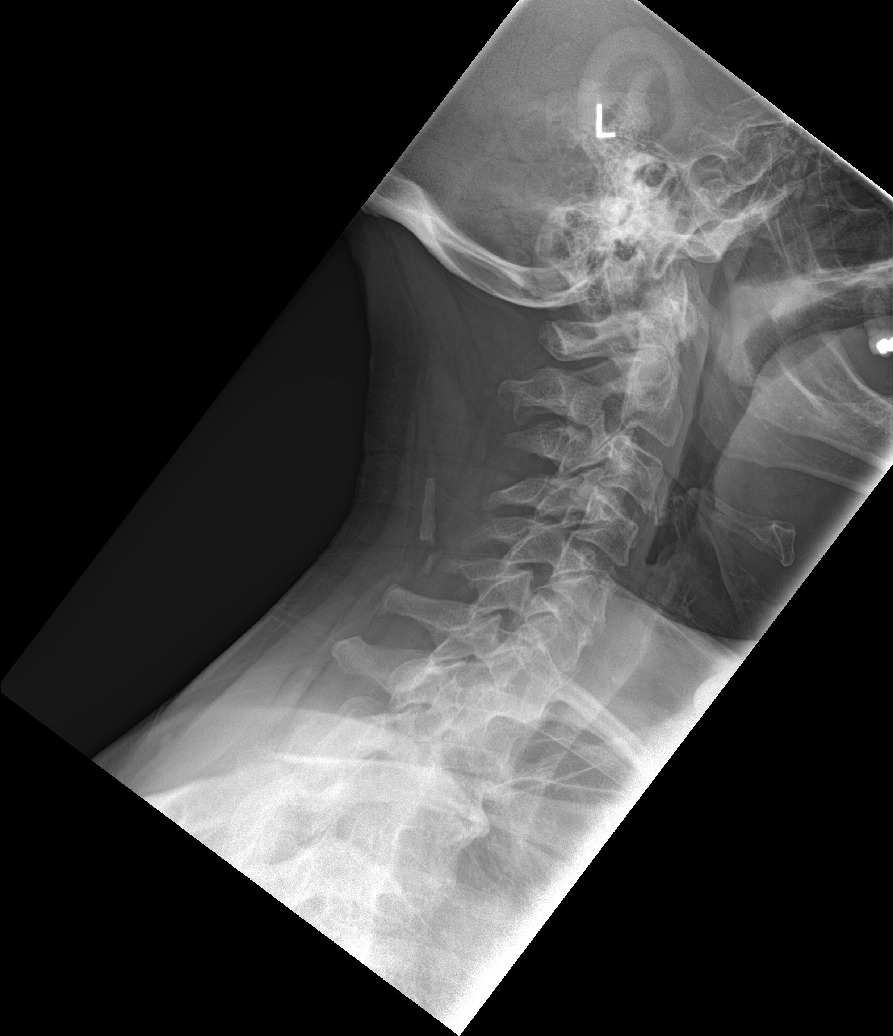

[c-spine obl (2 of 2)]
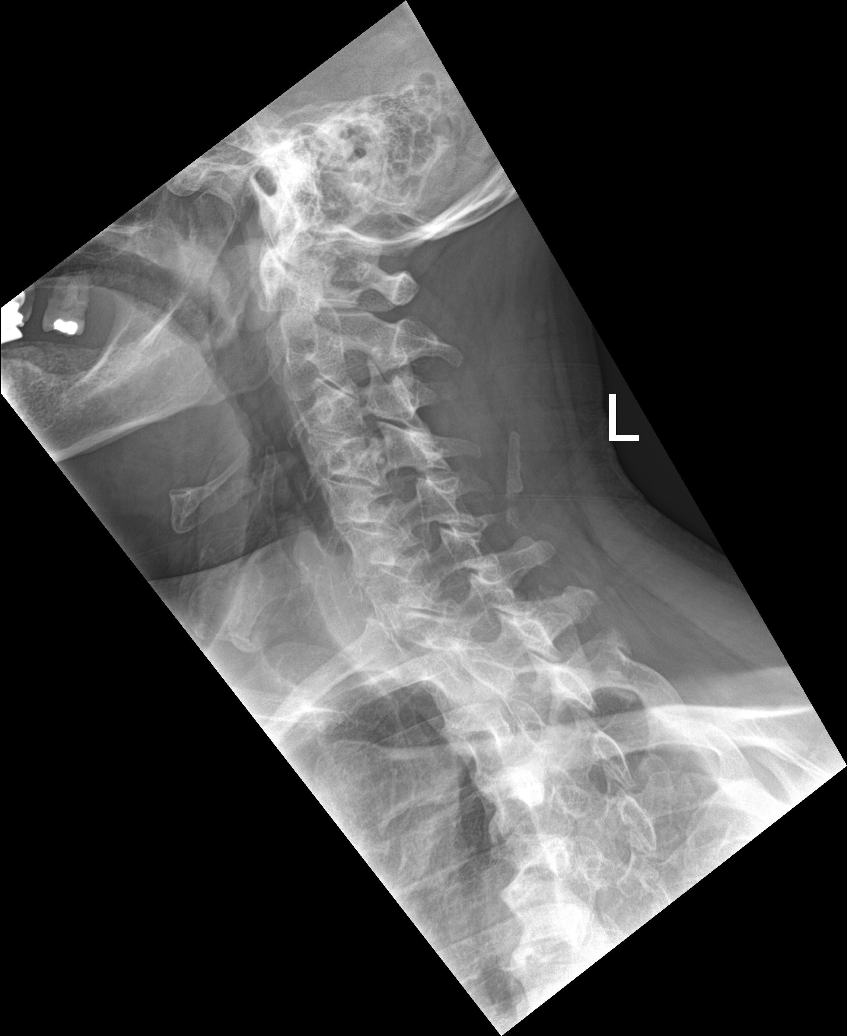

[c-spine ap]
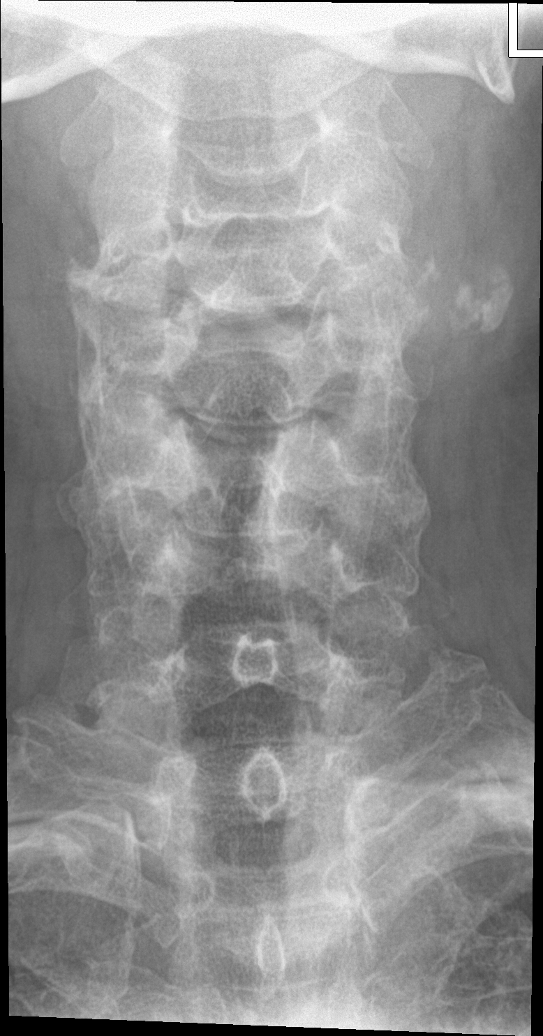

[c-spine open mouth]
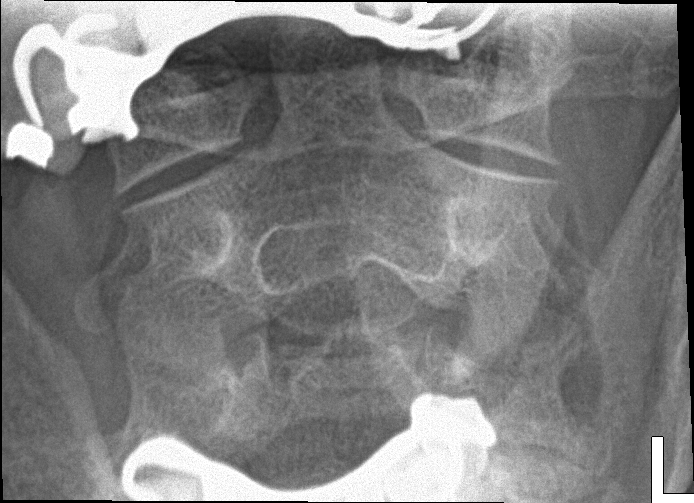

[c-spine lat (2 of 2)]
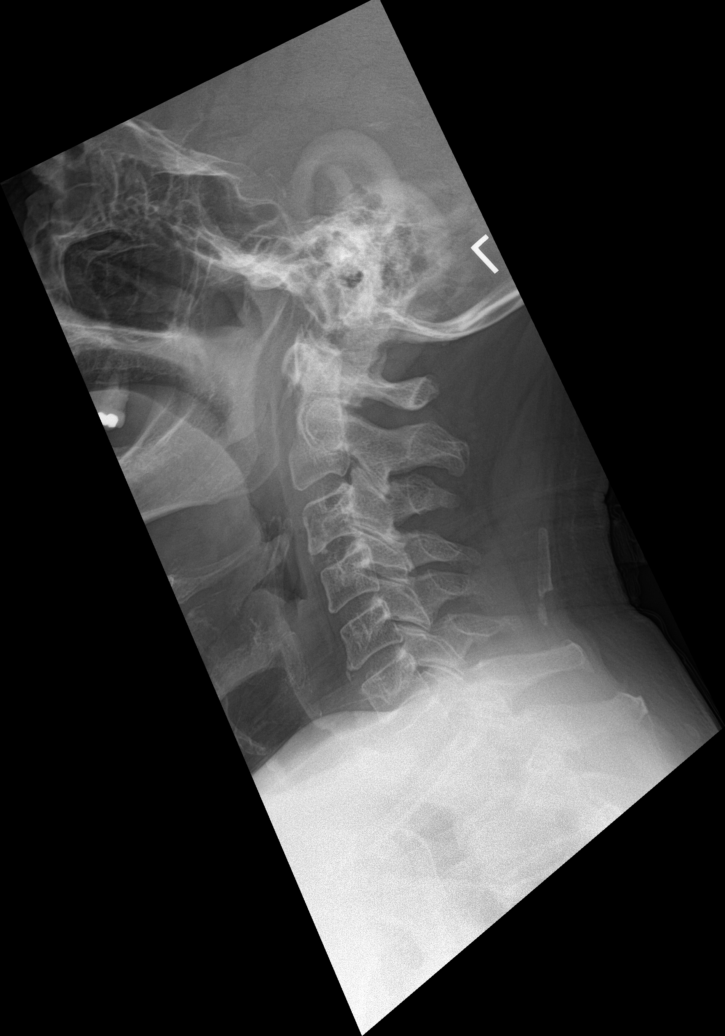

[6 of 6 positions shown; findings below may reference images not displayed]

FINDINGS: C7 and T1 are suboptimally imaged on the lateral view due to the
overlying shoulders. Anatomic alignment through the upper endplate
of C7. No visible fractures. Minimal disc space narrowing at C5-6.
Remaining disc spaces well-preserved. No evidence of facet
degenerative change. Calcification in the nuchal ligament
posteriorly. No static evidence of instability.

Atherosclerotic calcification of the left carotid artery.
IMPRESSION: No evidence of fracture or static signs of instability. Minimal
degenerative disc disease at C5-6.

## 2018-06-18 IMAGING — DX DG KNEE 1-2V*R*
2 series · 2 of 2 positions shown · non-contrast
Comparison: None.

CLINICAL DATA: 65-year-old who fell over a pallet while at work.
Neck pain, low back pain and right knee pain. Initial encounter.

EXAM:
RIGHT KNEE - 1-2 VIEW

[knee ap]
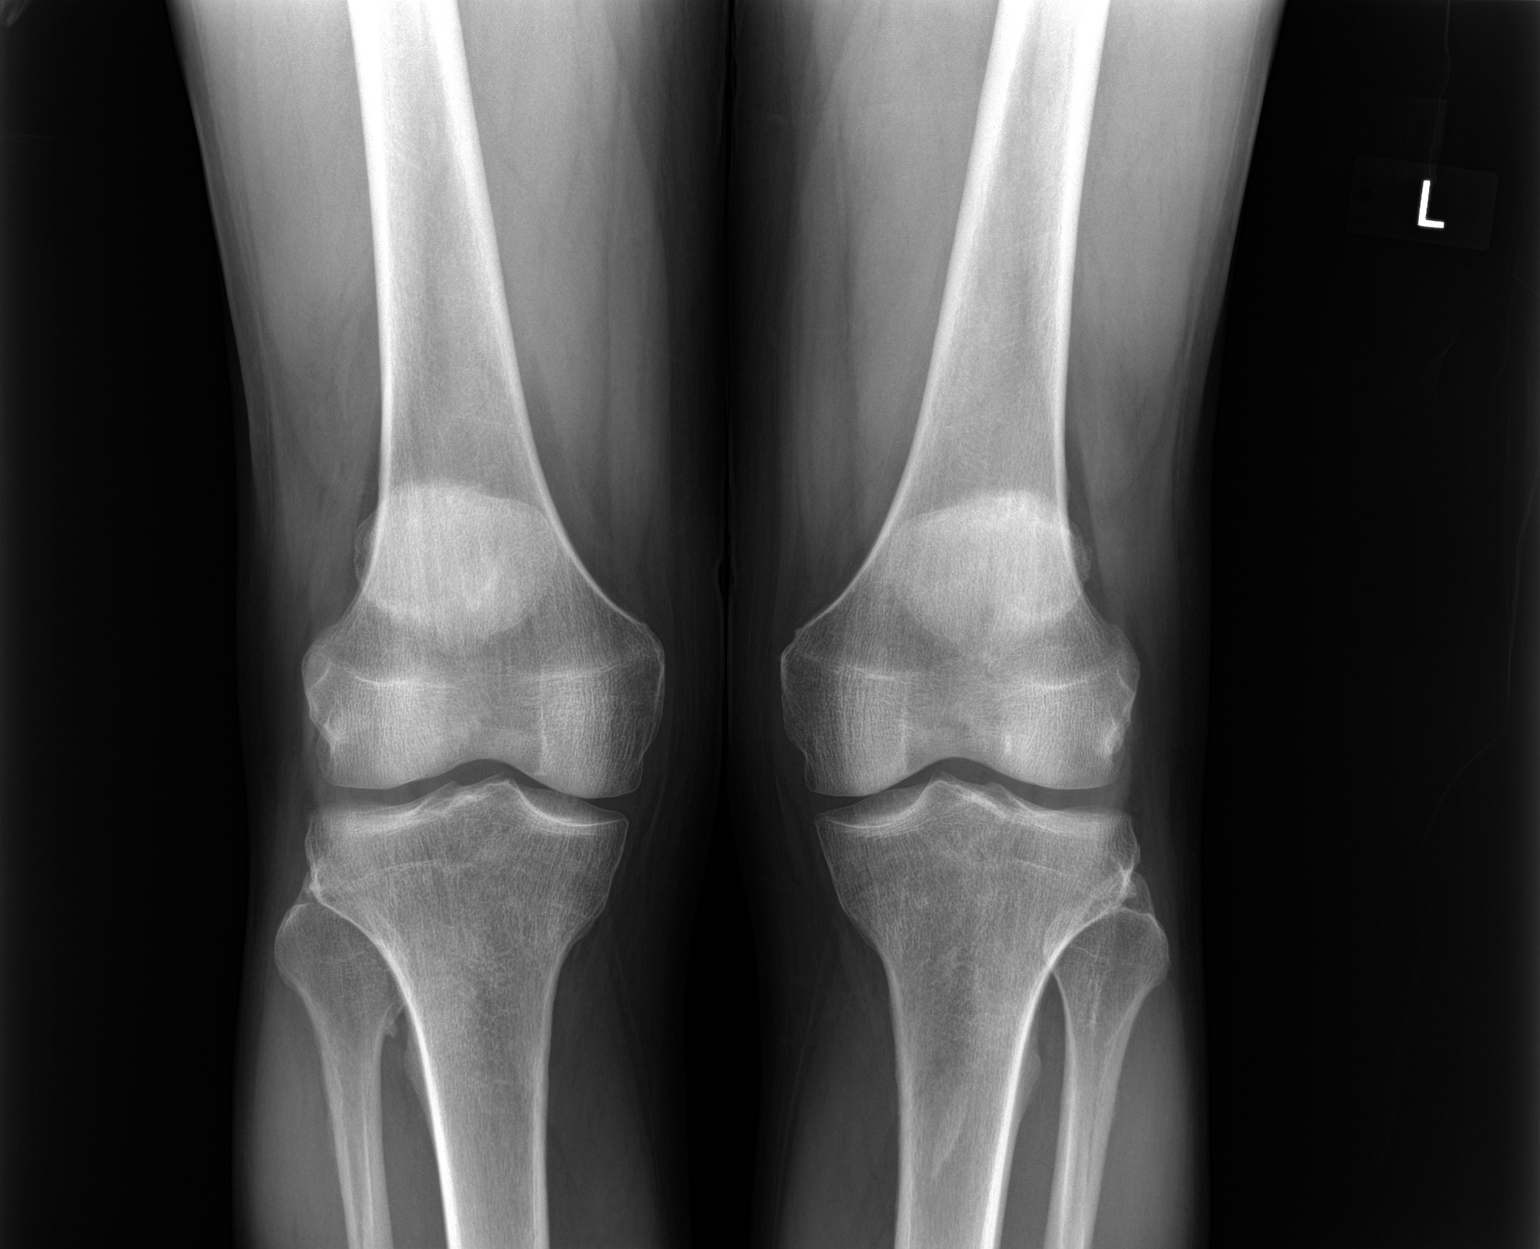

[knee lat]
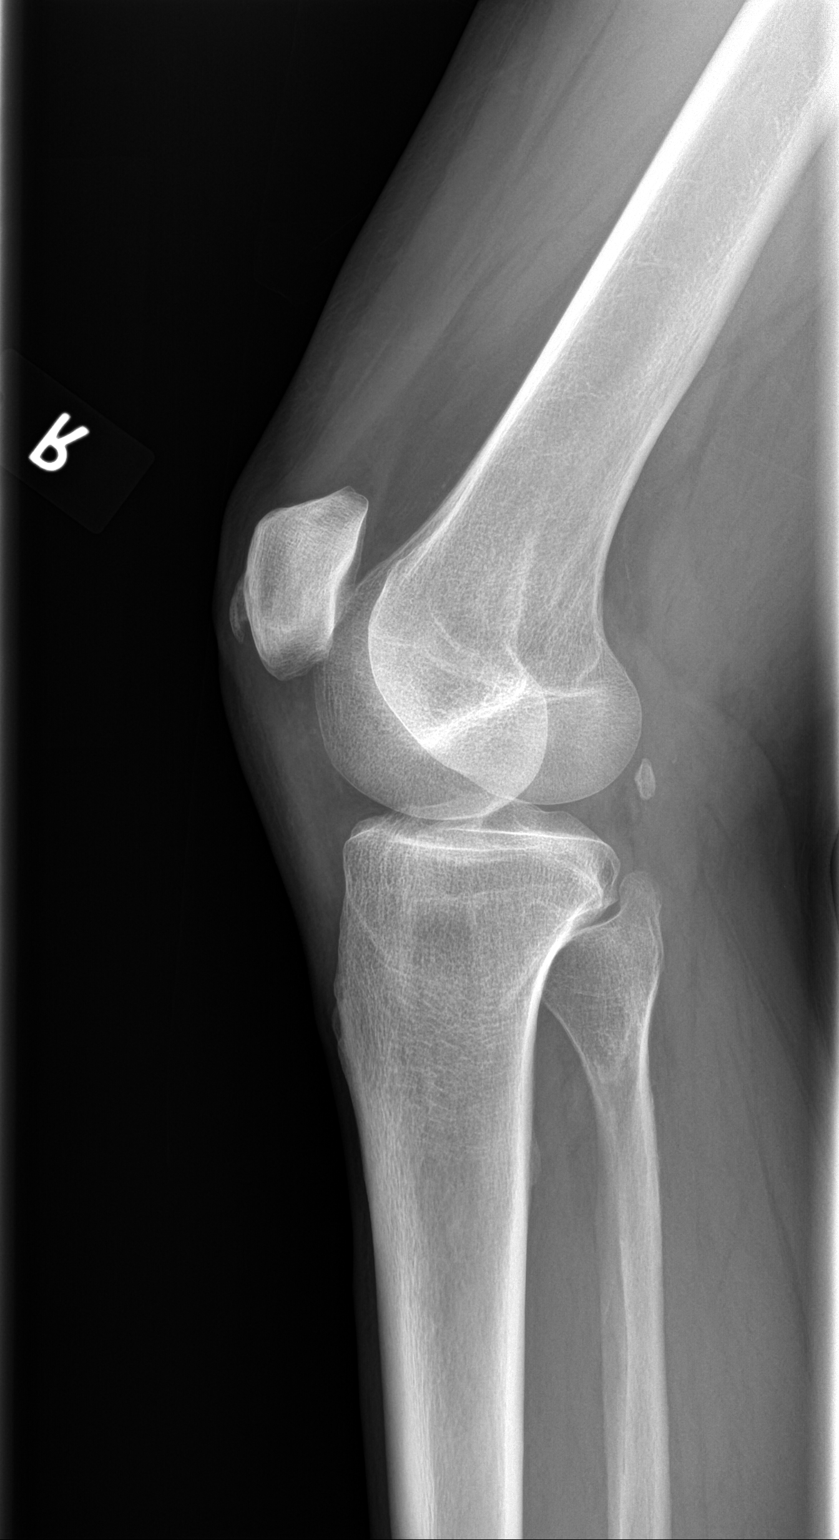

[2 of 2 positions shown; findings below may reference images not displayed]

FINDINGS: Standing AP views of both knees and a lateral view of the right knee
were obtained. No evidence of acute fracture or dislocation. Well
preserved joint spaces. Well-preserved bone mineral density. No
visible joint effusion. Small enthesopathic spur at the insertion of
the patellar tendon on the patella.

The AP view of the left knee is unremarkable.
IMPRESSION: No acute or significant osseous abnormality.

## 2018-06-20 ENCOUNTER — Ambulatory Visit (INDEPENDENT_AMBULATORY_CARE_PROVIDER_SITE_OTHER): Payer: Medicare HMO | Admitting: Pediatrics

## 2018-06-20 ENCOUNTER — Encounter: Payer: Self-pay | Admitting: Pediatrics

## 2018-06-20 VITALS — BP 143/70 | HR 80 | Temp 98.4°F | Ht 68.0 in | Wt 199.5 lb

## 2018-06-20 DIAGNOSIS — R5383 Other fatigue: Secondary | ICD-10-CM | POA: Diagnosis not present

## 2018-06-20 DIAGNOSIS — N529 Male erectile dysfunction, unspecified: Secondary | ICD-10-CM

## 2018-06-20 DIAGNOSIS — I10 Essential (primary) hypertension: Secondary | ICD-10-CM

## 2018-06-20 LAB — BASIC METABOLIC PANEL
BUN/Creatinine Ratio: 14 (ref 10–24)
BUN: 15 mg/dL (ref 8–27)
CALCIUM: 9.6 mg/dL (ref 8.6–10.2)
CO2: 29 mmol/L (ref 20–29)
Chloride: 92 mmol/L — ABNORMAL LOW (ref 96–106)
Creatinine, Ser: 1.08 mg/dL (ref 0.76–1.27)
GFR, EST AFRICAN AMERICAN: 83 mL/min/{1.73_m2} (ref >59)
GFR, EST NON AFRICAN AMERICAN: 72 mL/min/{1.73_m2} (ref >59)
Glucose: 122 mg/dL — ABNORMAL HIGH (ref 65–99)
Potassium: 2.8 mmol/L — ABNORMAL LOW (ref 3.5–5.2)
Sodium: 138 mmol/L (ref 134–144)

## 2018-06-20 MED ORDER — SILDENAFIL CITRATE 100 MG PO TABS: 50.0000 mg | ORAL_TABLET | Freq: Every day | ORAL | 0 refills | Status: DC | PRN

## 2018-06-20 MED ORDER — CHLORTHALIDONE 25 MG PO TABS: 25.0000 mg | ORAL_TABLET | ORAL | 2 refills | Status: DC

## 2018-06-20 NOTE — Progress Notes (Signed)
  Subjective:   Patient ID: Daniel Baxter, male    DOB: 1951-03-23, 67 y.o.   MRN: [id number] CC: Blood Pressure Check (pt here today for BP check after adding Chlorithalidone to his BP regimen, pt hasn't been able to take BP at home but c/o increased tiredness since starting)  HPI: Daniel Baxter is a 67 y.o. male   Started chlorthalidone 2 weeks ago.  He has been feeling tired since then.  Wants to sit on the sofa, not doing his usual activities.  Has been having trouble starting and achieving an erection.  Swelling is improved in his ankles.  No lightheadedness or dizziness.  Relevant past medical, surgical, family and social history reviewed. Allergies and medications reviewed and updated. Social History   Tobacco Use  Smoking Status Former Smoker  . Last attempt to quit: 10/16/2012  . Years since quitting: 5.6  Smokeless Tobacco Never Used   ROS: Per HPI   Objective:    BP (!) 143/70   Pulse 80   Temp 98.4 F (36.9 C) (Oral)   Ht 5\' 8"  (1.727 m)   Wt 199 lb 8 oz (90.5 kg)   BMI 30.33 kg/m   Wt Readings from Last 3 Encounters:  06/20/18 199 lb 8 oz (90.5 kg)  06/03/18 202 lb (91.6 kg)  10/03/17 193 lb (87.5 kg)    Gen: NAD, alert, cooperative with exam, NCAT EYES: EOMI, no conjunctival injection, or no icterus CV: NRRR, normal S1/S2, no murmur, distal pulses 2+ b/l Resp: CTABL, no wheezes, normal WOB Abd: +BS, soft, NTND. no guarding or organomegaly Ext: No edema, warm Neuro: Alert and oriented, strength equal b/l UE and LE, coordination grossly normal MSK: normal muscle bulk  Assessment & Plan:  Daniel Baxter was seen today for blood pressure check.  Diagnoses and all orders for this visit:  Essential hypertension Improved control now with tiredness.  Given how long-acting chlorthalidone is, could try taking it Monday Wednesday Friday.  Hopefully the feeling of tiredness will improve as patient continues to having more normal blood pressures rather than the  extremely elevated pressures he has been used to.  Blood pressure still borderline elevated today.  Blood work today, follow-up with me in 4 weeks. -     chlorthalidone (HYGROTON) 25 MG tablet; Take 1 tablet (25 mg total) by mouth every Monday, Wednesday, and Friday. -     Basic Metabolic Panel  Erectile dysfunction, unspecified erectile dysfunction type -     sildenafil (VIAGRA) 100 MG tablet; Take 0.5-1 tablets (50-100 mg total) by mouth daily as needed for erectile dysfunction.  Fatigue As above  Follow up plan: Return in about 1 month (around 07/18/2018). Rex Krasarol Ly Bacchi, MD [facility]

## 2018-06-21 ENCOUNTER — Other Ambulatory Visit: Payer: Self-pay | Admitting: *Deleted

## 2018-06-21 ENCOUNTER — Other Ambulatory Visit: Payer: Self-pay | Admitting: Pediatrics

## 2018-06-21 DIAGNOSIS — E876 Hypokalemia: Secondary | ICD-10-CM

## 2018-06-21 MED ORDER — POTASSIUM CHLORIDE ER 20 MEQ PO TBCR: EXTENDED_RELEASE_TABLET | ORAL | 1 refills | Status: DC

## 2018-06-28 ENCOUNTER — Other Ambulatory Visit: Payer: Medicare HMO

## 2018-06-28 DIAGNOSIS — E876 Hypokalemia: Secondary | ICD-10-CM | POA: Diagnosis not present

## 2018-06-29 LAB — BMP8+EGFR
BUN/Creatinine Ratio: 8 — ABNORMAL LOW (ref 10–24)
BUN: 10 mg/dL (ref 8–27)
CO2: 24 mmol/L (ref 20–29)
CREATININE: 1.19 mg/dL (ref 0.76–1.27)
Calcium: 9.5 mg/dL (ref 8.6–10.2)
Chloride: 96 mmol/L (ref 96–106)
GFR calc Af Amer: 74 mL/min/{1.73_m2} (ref >59)
GFR, EST NON AFRICAN AMERICAN: 64 mL/min/{1.73_m2} (ref >59)
GLUCOSE: 124 mg/dL — AB (ref 65–99)
Potassium: 3.7 mmol/L (ref 3.5–5.2)
Sodium: 136 mmol/L (ref 134–144)

## 2018-07-13 ENCOUNTER — Other Ambulatory Visit: Payer: Self-pay | Admitting: Pediatrics

## 2018-07-13 DIAGNOSIS — E876 Hypokalemia: Secondary | ICD-10-CM

## 2018-07-23 ENCOUNTER — Ambulatory Visit: Payer: Self-pay | Admitting: Pediatrics

## 2018-07-24 ENCOUNTER — Encounter: Payer: Self-pay | Admitting: Pediatrics

## 2018-08-17 ENCOUNTER — Other Ambulatory Visit: Payer: Self-pay | Admitting: Pediatrics

## 2018-08-17 DIAGNOSIS — E876 Hypokalemia: Secondary | ICD-10-CM

## 2018-08-25 ENCOUNTER — Other Ambulatory Visit: Payer: Self-pay | Admitting: Pediatrics

## 2018-08-25 DIAGNOSIS — I10 Essential (primary) hypertension: Secondary | ICD-10-CM

## 2018-09-11 ENCOUNTER — Other Ambulatory Visit: Payer: Self-pay | Admitting: Pediatrics

## 2018-09-11 DIAGNOSIS — E876 Hypokalemia: Secondary | ICD-10-CM

## 2018-11-18 ENCOUNTER — Other Ambulatory Visit: Payer: Self-pay | Admitting: Pediatrics

## 2018-11-18 DIAGNOSIS — I10 Essential (primary) hypertension: Secondary | ICD-10-CM

## 2018-12-14 ENCOUNTER — Other Ambulatory Visit: Payer: Self-pay | Admitting: Pediatrics

## 2018-12-14 DIAGNOSIS — I10 Essential (primary) hypertension: Secondary | ICD-10-CM

## 2018-12-16 NOTE — Telephone Encounter (Signed)
Last seen 06/24/18

## 2019-01-07 ENCOUNTER — Ambulatory Visit (INDEPENDENT_AMBULATORY_CARE_PROVIDER_SITE_OTHER): Payer: Medicare HMO | Admitting: Family Medicine

## 2019-01-07 VITALS — BP 177/90 | HR 95 | Temp 98.2°F | Ht 68.0 in | Wt 204.0 lb

## 2019-01-07 DIAGNOSIS — E782 Mixed hyperlipidemia: Secondary | ICD-10-CM | POA: Diagnosis not present

## 2019-01-07 DIAGNOSIS — R69 Illness, unspecified: Secondary | ICD-10-CM | POA: Diagnosis not present

## 2019-01-07 DIAGNOSIS — I10 Essential (primary) hypertension: Secondary | ICD-10-CM

## 2019-01-07 DIAGNOSIS — Z125 Encounter for screening for malignant neoplasm of prostate: Secondary | ICD-10-CM | POA: Diagnosis not present

## 2019-01-07 DIAGNOSIS — F17200 Nicotine dependence, unspecified, uncomplicated: Secondary | ICD-10-CM

## 2019-01-07 DIAGNOSIS — E559 Vitamin D deficiency, unspecified: Secondary | ICD-10-CM | POA: Diagnosis not present

## 2019-01-07 DIAGNOSIS — E876 Hypokalemia: Secondary | ICD-10-CM

## 2019-01-07 MED ORDER — AMLODIPINE BESYLATE 10 MG PO TABS: 10.0000 mg | ORAL_TABLET | Freq: Every day | ORAL | 1 refills | Status: DC

## 2019-01-07 MED ORDER — POTASSIUM CHLORIDE ER 20 MEQ PO TBCR: EXTENDED_RELEASE_TABLET | ORAL | 1 refills | Status: DC

## 2019-01-07 MED ORDER — PRAVASTATIN SODIUM 80 MG PO TABS: 80.0000 mg | ORAL_TABLET | Freq: Every day | ORAL | 1 refills | Status: DC

## 2019-01-07 NOTE — Progress Notes (Signed)
Subjective: CC: Hypertension, hyperlipidemia PCP: Janora Norlander, DO Daniel Baxter is a 68 y.o. male presenting to clinic today for:  1.  Hypertension, hyperlipidemia Patient with longstanding history of hypertension and hyperlipidemia.  Last lipid panel was in 2018 and demonstrated an LDL of 73.  Last TSH was in November 2018 and was also normal.  He denies any chest pain, shortness of breath, visual disturbance or lower extremity edema.  He reports compliance with Norvasc 10 mg daily, potassium and Pravachol 80.  He has been using the hydrochlorothiazide intermittently because he thought his blood pressure was under fairly good control and he did not need it.  He is an active every day smoker and has smoked about a pack per day for over 55 years.  No hemoptysis or unplanned weight loss.   ROS: Per HPI  Allergies  Allergen Reactions  . Lisinopril Anaphylaxis    angioedema   Past Medical History:  Diagnosis Date  . Hyperlipidemia   . Hypertension     Current Outpatient Medications:  .  amLODipine (NORVASC) 10 MG tablet, TAKE 1 TABLET BY MOUTH EVERY DAY, Disp: 90 tablet, Rfl: 0 .  chlorthalidone (HYGROTON) 25 MG tablet, TAKE 1 TABLET BY MOUTH EVERY DAY, Disp: 90 tablet, Rfl: 1 .  Potassium Chloride ER 20 MEQ TBCR, TAKE 1 TABLET BY MOUTH EVERY DAY, Disp: 90 tablet, Rfl: 0 .  pravastatin (PRAVACHOL) 80 MG tablet, Take 1 tablet (80 mg total) by mouth daily., Disp: 90 tablet, Rfl: 1 .  sildenafil (VIAGRA) 100 MG tablet, Take 0.5-1 tablets (50-100 mg total) by mouth daily as needed for erectile dysfunction., Disp: 10 tablet, Rfl: 0 Social History   Socioeconomic History  . Marital status: Married    Spouse name: Not on file  . Number of children: Not on file  . Years of education: Not on file  . Highest education level: Not on file  Occupational History  . Not on file  Social Needs  . Financial resource strain: Not on file  . Food insecurity:    Worry: Not on file   Inability: Not on file  . Transportation needs:    Medical: Not on file    Non-medical: Not on file  Tobacco Use  . Smoking status: Former Smoker    Last attempt to quit: 10/16/2012    Years since quitting: 6.2  . Smokeless tobacco: Never Used  Substance and Sexual Activity  . Alcohol use: No  . Drug use: No  . Sexual activity: Not on file  Lifestyle  . Physical activity:    Days per week: Not on file    Minutes per session: Not on file  . Stress: Not on file  Relationships  . Social connections:    Talks on phone: Not on file    Gets together: Not on file    Attends religious service: Not on file    Active member of club or organization: Not on file    Attends meetings of clubs or organizations: Not on file    Relationship status: Not on file  . Intimate partner violence:    Fear of current or ex partner: Not on file    Emotionally abused: Not on file    Physically abused: Not on file    Forced sexual activity: Not on file  Other Topics Concern  . Not on file  Social History Narrative  . Not on file   No family history on file.  Objective: Office vital signs  reviewed. BP (!) 177/90   Pulse 95   Temp 98.2 F (36.8 C) (Oral)   Ht _0  (1.727 m)   Wt 204 lb (92.5 kg)   BMI 31.02 kg/m   Physical Examination:  General: Awake, alert, No acute distress HEENT: Normal, sclera white.  Smells of tobacco Cardio: regular rate and rhythm, S1S2 heard, soft murmur noted at RSB Pulm: globally decreased breath sounds with prolonged expiratory phase. No wheeze, rhonchi or rales. Normal work of breathing on room air.  Assessment/ Plan: 68 y.o. male   1. Essential hypertension Not controlled.  We discussed resuming use of hydrochlorothiazide daily.  He will follow-up in 1 week for blood pressure recheck with the nurse.  If persistently elevated, will need to consider adding an additional agent.  He has history of anaphylaxis to ACE inhibitors therefore would not use ace  inhibitor or ARB.  May need to consider adding beta-blocker versus triamterene.  If considering adding triamterene will discontinue potassium. - CMP14+EGFR - amLODipine (NORVASC) 10 MG tablet; Take 1 tablet (10 mg total) by mouth daily.  Dispense: 90 tablet; Refill: 1 - Potassium Chloride ER 20 MEQ TBCR; TAKE 1 TABLET BY MOUTH EVERY DAY  Dispense: 90 tablet; Refill: 1  2. Mixed hyperlipidemia Check lipid panel and thyroid function.  Pravachol refilled - TSH - Lipid Panel - pravastatin (PRAVACHOL) 80 MG tablet; Take 1 tablet (80 mg total) by mouth daily.  Dispense: 90 tablet; Refill: 1  3. Hypokalemia - Potassium Chloride ER 20 MEQ TBCR; TAKE 1 TABLET BY MOUTH EVERY DAY  Dispense: 90 tablet; Refill: 1  4. Vitamin D deficiency - VITAMIN D 25 Hydroxy (Vit-D Deficiency, Fractures)  5. Screening for malignant neoplasm of prostate - PSA  6. Tobacco use disorder Counseling performed.  He is pre-contemplative.   Orders Placed This Encounter  Procedures  . CMP14+EGFR  . TSH  . Lipid Panel  . PSA  . VITAMIN D 25 Hydroxy (Vit-D Deficiency, Fractures)   Meds ordered this encounter  Medications  . amLODipine (NORVASC) 10 MG tablet    Sig: Take 1 tablet (10 mg total) by mouth daily.    Dispense:  90 tablet    Refill:  1  . pravastatin (PRAVACHOL) 80 MG tablet    Sig: Take 1 tablet (80 mg total) by mouth daily.    Dispense:  90 tablet    Refill:  1  . Potassium Chloride ER 20 MEQ TBCR    Sig: TAKE 1 TABLET BY MOUTH EVERY DAY    Dispense:  90 tablet    Refill:  [place], DO [place] [phone]

## 2019-01-08 LAB — LIPID PANEL
Chol/HDL Ratio: 3.2 ratio (ref 0.0–5.0)
Cholesterol, Total: 146 mg/dL (ref 100–199)
HDL: 46 mg/dL (ref >39)
LDL Calculated: 81 mg/dL (ref 0–99)
Triglycerides: 97 mg/dL (ref 0–149)
VLDL CHOLESTEROL CAL: 19 mg/dL (ref 5–40)

## 2019-01-08 LAB — CMP14+EGFR
ALK PHOS: 105 IU/L (ref 39–117)
ALT: 52 IU/L — ABNORMAL HIGH (ref 0–44)
AST: 48 IU/L — ABNORMAL HIGH (ref 0–40)
Albumin/Globulin Ratio: 1.2 (ref 1.2–2.2)
Albumin: 4.1 g/dL (ref 3.8–4.8)
BUN/Creatinine Ratio: 10 (ref 10–24)
BUN: 9 mg/dL (ref 8–27)
Bilirubin Total: 0.3 mg/dL (ref 0.0–1.2)
CO2: 22 mmol/L (ref 20–29)
Calcium: 9.4 mg/dL (ref 8.6–10.2)
Chloride: 100 mmol/L (ref 96–106)
Creatinine, Ser: 0.87 mg/dL (ref 0.76–1.27)
GFR calc Af Amer: 105 mL/min/{1.73_m2} (ref >59)
GFR calc non Af Amer: 91 mL/min/{1.73_m2} (ref >59)
Globulin, Total: 3.4 g/dL (ref 1.5–4.5)
Glucose: 100 mg/dL — ABNORMAL HIGH (ref 65–99)
Potassium: 3.9 mmol/L (ref 3.5–5.2)
Sodium: 140 mmol/L (ref 134–144)
Total Protein: 7.5 g/dL (ref 6.0–8.5)

## 2019-01-08 LAB — TSH: TSH: 4.02 u[IU]/mL (ref 0.450–4.500)

## 2019-01-08 LAB — PSA: Prostate Specific Ag, Serum: 1 ng/mL (ref 0.0–4.0)

## 2019-01-08 LAB — VITAMIN D 25 HYDROXY (VIT D DEFICIENCY, FRACTURES): Vit D, 25-Hydroxy: 35.7 ng/mL (ref 30.0–100.0)

## 2019-01-14 ENCOUNTER — Ambulatory Visit: Payer: Medicare HMO | Admitting: *Deleted

## 2019-01-14 VITALS — BP 138/64 | HR 78

## 2019-01-14 DIAGNOSIS — I10 Essential (primary) hypertension: Secondary | ICD-10-CM

## 2019-01-14 NOTE — Progress Notes (Signed)
Patient here for nurse blood pressure check due to elevated readings.  Dr. Nadine Counts restarted him on Chlorthalidone 25 mg daily . First reading with machine was 182/77 and second reading ,done manually, was 158/78, heart rate 78.  Third manual reading was 138/64.  Doctor advised for patient to continue with same medication and keep follow up appointment.

## 2019-04-09 ENCOUNTER — Ambulatory Visit: Payer: Self-pay | Admitting: Family Medicine

## 2019-06-10 ENCOUNTER — Other Ambulatory Visit: Payer: Self-pay | Admitting: Family Medicine

## 2019-06-10 DIAGNOSIS — E876 Hypokalemia: Secondary | ICD-10-CM

## 2019-06-10 DIAGNOSIS — I10 Essential (primary) hypertension: Secondary | ICD-10-CM

## 2019-06-11 ENCOUNTER — Ambulatory Visit: Payer: Medicare HMO | Admitting: Family Medicine

## 2019-06-30 ENCOUNTER — Other Ambulatory Visit: Payer: Self-pay | Admitting: *Deleted

## 2019-06-30 DIAGNOSIS — I10 Essential (primary) hypertension: Secondary | ICD-10-CM

## 2019-06-30 MED ORDER — CHLORTHALIDONE 25 MG PO TABS: 25.0000 mg | ORAL_TABLET | Freq: Every day | ORAL | 0 refills | Status: DC

## 2019-07-07 ENCOUNTER — Other Ambulatory Visit: Payer: Self-pay

## 2019-07-08 ENCOUNTER — Ambulatory Visit (INDEPENDENT_AMBULATORY_CARE_PROVIDER_SITE_OTHER): Payer: Medicare HMO | Admitting: Family Medicine

## 2019-07-08 ENCOUNTER — Encounter: Payer: Self-pay | Admitting: Family Medicine

## 2019-07-08 VITALS — BP 149/70 | HR 89 | Temp 98.9°F | Ht 68.0 in | Wt 200.0 lb

## 2019-07-08 DIAGNOSIS — K047 Periapical abscess without sinus: Secondary | ICD-10-CM

## 2019-07-08 DIAGNOSIS — R945 Abnormal results of liver function studies: Secondary | ICD-10-CM | POA: Diagnosis not present

## 2019-07-08 DIAGNOSIS — E782 Mixed hyperlipidemia: Secondary | ICD-10-CM | POA: Diagnosis not present

## 2019-07-08 DIAGNOSIS — I10 Essential (primary) hypertension: Secondary | ICD-10-CM | POA: Diagnosis not present

## 2019-07-08 DIAGNOSIS — R7989 Other specified abnormal findings of blood chemistry: Secondary | ICD-10-CM

## 2019-07-08 DIAGNOSIS — R69 Illness, unspecified: Secondary | ICD-10-CM | POA: Diagnosis not present

## 2019-07-08 DIAGNOSIS — F17200 Nicotine dependence, unspecified, uncomplicated: Secondary | ICD-10-CM

## 2019-07-08 MED ORDER — PRAVASTATIN SODIUM 80 MG PO TABS: 80.0000 mg | ORAL_TABLET | Freq: Every day | ORAL | 1 refills | Status: DC

## 2019-07-08 MED ORDER — AMOXICILLIN-POT CLAVULANATE 875-125 MG PO TABS: 1.0000 | ORAL_TABLET | Freq: Two times a day (BID) | ORAL | 0 refills | Status: DC

## 2019-07-08 MED ORDER — AMLODIPINE BESYLATE 10 MG PO TABS: 10.0000 mg | ORAL_TABLET | Freq: Every day | ORAL | 1 refills | Status: DC

## 2019-07-08 MED ORDER — OMEPRAZOLE 20 MG PO CPDR: 20.0000 mg | DELAYED_RELEASE_CAPSULE | Freq: Every day | ORAL | 3 refills | Status: DC

## 2019-07-08 MED ORDER — CHLORTHALIDONE 25 MG PO TABS: 25.0000 mg | ORAL_TABLET | Freq: Every day | ORAL | 1 refills | Status: DC

## 2019-07-08 NOTE — Patient Instructions (Signed)
You had labs performed today.  You will be contacted with the results of the labs once they are available, usually in the next 3 business days for routine lab work.  If you have an active my chart account, they will be released to your MyChart.  If you prefer to have these labs released to you via telephone, please let us know.  If you had a pap smear or biopsy performed, expect to be contacted in about 7-10 days.  Dental Abscess  A dental abscess is an area of pus in or around a tooth. It comes from an infection. It can cause pain and other symptoms. Treatment will help with symptoms and prevent the infection from spreading. Follow these instructions at home: Medicines  Take over-the-counter and prescription medicines only as told by your dentist.  If you were prescribed an antibiotic medicine, take it as told by your dentist. Do not stop taking it even if you start to feel better.  If you were prescribed a gel that has numbing medicine in it, use it exactly as told.  Do not drive or use heavy machinery (like a Conservation officer, nature) while taking prescription pain medicine. General instructions  Rinse out your mouth often with salt water. ? To make salt water, dissolve -1 tsp of salt in 1 cup of warm water.  Eat a soft diet while your mouth is healing.  Drink enough fluid to keep your urine pale yellow.  Do not apply heat to the outside of your mouth.  Do not use any products that contain nicotine or tobacco. These include cigarettes and e-cigarettes. If you need help quitting, ask your doctor.  Keep all follow-up visits as told by your dentist. This is important. Prevent an abscess  Brush your teeth every morning and every night. Use fluoride toothpaste.  Floss your teeth each day.  Get dental cleanings as often as told by your dentist.  Think about getting dental sealant put on teeth that have deep holes (decay).  Drink water that has fluoride in it. ? Most tap water has fluoride.  ? Check the label on bottled water to see if it has fluoride in it.  Drink water instead of sugary drinks.  Eat healthy meals and snacks.  Wear a mouth guard or face shield when you play sports. Contact a doctor if:  Your pain is worse, and medicine does not help. Get help right away if:  You have a fever or chills.  Your symptoms suddenly get worse.  You have a very bad headache.  You have problems breathing or swallowing.  You have trouble opening your mouth.  You have swelling in your neck or close to your eye. Summary  A dental abscess is an area of pus in or around a tooth. It is caused by an infection.  Treatment will help with symptoms and prevent the infection from spreading.  Take over-the-counter and prescription medicines only as told by your dentist.  To prevent an abscess, take good care of your teeth. Brush your teeth every morning and night. Use floss every day.  Get dental cleanings as often as told by your dentist. This information is not intended to replace advice given to you by your health care provider. Make sure you discuss any questions you have with your health care provider. Document Released: 03/30/2015 Document Revised: 03/05/2019 Document Reviewed: 07/16/2017 Elsevier Patient Education  2020 [employer].

## 2019-07-08 NOTE — Progress Notes (Signed)
Subjective: CC: GERD, hypertension, hyperlipidemia PCP: Janora Norlander, DO ELF:[id number] Boven is a 68 y.o. male presenting to clinic today for:  1.  GERD Patient reports long standing history of GERD.  He reports he has been taking OTC Prilosec and wishes to have a prescription of this for cost reasons.  He denies any hematochezia, melena, nausea, vomiting, unplanned weight loss or abdominal pain.  He reports good control with the Prilosec.  2.  Hypertension, hyperlipidemia, tobacco use disorder Patient reports compliance with his Norvasc 10 mg, chlorthalidone 25 mg, potassium 20 mEq and Pravachol 80 mg.  No chest pain, shortness of breath, dizziness or lower extremity edema.  He does report ongoing tobacco use but notes he is cut down to about 5 cigarettes/day.  3.  Dental infection Patient reports a right sided dental abscess that onset about 1 week ago.  He reports that the swelling has gotten slightly better since he has been taking ibuprofen but he still has quite a bit of tenderness and a palpable knot on his gumline.  He has not yet scheduled an appointment with the dentist but will be doing this soon.  No fevers.   ROS: Per HPI  Allergies  Allergen Reactions  . Lisinopril Anaphylaxis    angioedema   Past Medical History:  Diagnosis Date  . Hyperlipidemia   . Hypertension     Current Outpatient Medications:  .  amLODipine (NORVASC) 10 MG tablet, Take 1 tablet (10 mg total) by mouth daily., Disp: 90 tablet, Rfl: 1 .  chlorthalidone (HYGROTON) 25 MG tablet, Take 1 tablet (25 mg total) by mouth daily., Disp: 90 tablet, Rfl: 1 .  Potassium Chloride ER 20 MEQ TBCR, TAKE 1 TABLET BY MOUTH EVERY DAY, Disp: 90 tablet, Rfl: 0 .  pravastatin (PRAVACHOL) 80 MG tablet, Take 1 tablet (80 mg total) by mouth daily., Disp: 90 tablet, Rfl: 1 .  amoxicillin-clavulanate (AUGMENTIN) 875-125 MG tablet, Take 1 tablet by mouth 2 (two) times daily., Disp: 20 tablet, Rfl: 0 .  omeprazole  (PRILOSEC) 20 MG capsule, Take 1 capsule (20 mg total) by mouth daily., Disp: 90 capsule, Rfl: 3 Social History   Socioeconomic History  . Marital status: Married    Spouse name: Not on file  . Number of children: Not on file  . Years of education: Not on file  . Highest education level: Not on file  Occupational History  . Not on file  Social Needs  . Financial resource strain: Not on file  . Food insecurity    Worry: Not on file    Inability: Not on file  . Transportation needs    Medical: Not on file    Non-medical: Not on file  Tobacco Use  . Smoking status: Current Every Day Smoker    Packs/day: 0.25    Types: Cigarettes  . Smokeless tobacco: Never Used  Substance and Sexual Activity  . Alcohol use: No  . Drug use: No  . Sexual activity: Not Currently  Lifestyle  . Physical activity    Days per week: Not on file    Minutes per session: Not on file  . Stress: Not on file  Relationships  . Social Herbalist on phone: Not on file    Gets together: Not on file    Attends religious service: Not on file    Active member of club or organization: Not on file    Attends meetings of clubs or organizations: Not on file  Relationship status: Not on file  . Intimate partner violence    Fear of current or ex partner: Not on file    Emotionally abused: Not on file    Physically abused: Not on file    Forced sexual activity: Not on file  Other Topics Concern  . Not on file  Social History Narrative  . Not on file   History reviewed. No pertinent family history.  Objective: Office vital signs reviewed. BP (!) 149/70   Pulse 89   Temp 98.9 F (37.2 C) (Temporal)   Ht 5' 8"  (1.727 m)   Wt 200 lb (90.7 kg)   BMI 30.41 kg/m   Physical Examination:  General: Awake, alert, well nourished, No acute distress HEENT: Normal.  No facial swelling appreciated but he does have a palpable knot on the right upper gumline.  He has several broken and missing teeth  noted.  No gross purulence noted but he has a foul odor to his breath.  He also smells of tobacco.  He has tobacco staining of the tongue. Cardio: regular rate and rhythm, S1S2 heard, no murmurs appreciated Pulm: clear to auscultation bilaterally, no wheezes, rhonchi or rales; normal work of breathing on room air  Assessment/ Plan: 68 y.o. male   1. Essential hypertension Borderline uncontrolled but I think this has to do with anxiety related to mask wearing.  Continue current regimen.  Check CMP - amLODipine (NORVASC) 10 MG tablet; Take 1 tablet (10 mg total) by mouth daily.  Dispense: 90 tablet; Refill: 1 - chlorthalidone (HYGROTON) 25 MG tablet; Take 1 tablet (25 mg total) by mouth daily.  Dispense: 90 tablet; Refill: 1 - CMP14+EGFR  2. Mixed hyperlipidemia Continue statin.  Check LFTs - pravastatin (PRAVACHOL) 80 MG tablet; Take 1 tablet (80 mg total) by mouth daily.  Dispense: 90 tablet; Refill: 1 - CMP14+EGFR  3. Tobacco use disorder Contemplative.  4. Elevated liver function tests Continues to drink 2 beers per night.  Counseling. - CMP14+EGFR  5. Dental abscess Augmentin 875 p.o. twice daily prescribed.  Home care instructions reviewed.  Reasons for emergent evaluation emergency department discussed.  I advised him to schedule an appointment with dentistry ASAP.   Orders Placed This Encounter  Procedures  . CMP14+EGFR   Meds ordered this encounter  Medications  . amLODipine (NORVASC) 10 MG tablet    Sig: Take 1 tablet (10 mg total) by mouth daily.    Dispense:  90 tablet    Refill:  1  . chlorthalidone (HYGROTON) 25 MG tablet    Sig: Take 1 tablet (25 mg total) by mouth daily.    Dispense:  90 tablet    Refill:  1  . pravastatin (PRAVACHOL) 80 MG tablet    Sig: Take 1 tablet (80 mg total) by mouth daily.    Dispense:  90 tablet    Refill:  1  . omeprazole (PRILOSEC) 20 MG capsule    Sig: Take 1 capsule (20 mg total) by mouth daily.    Dispense:  90 capsule     Refill:  3  . amoxicillin-clavulanate (AUGMENTIN) 875-125 MG tablet    Sig: Take 1 tablet by mouth 2 (two) times daily.    Dispense:  20 tablet    Refill:  [place], DO [place] [phone]

## 2019-07-09 ENCOUNTER — Other Ambulatory Visit: Payer: Self-pay | Admitting: Family Medicine

## 2019-07-09 ENCOUNTER — Telehealth: Payer: Self-pay | Admitting: Family Medicine

## 2019-07-09 DIAGNOSIS — E876 Hypokalemia: Secondary | ICD-10-CM

## 2019-07-09 LAB — CMP14+EGFR
ALT: 46 IU/L — ABNORMAL HIGH (ref 0–44)
AST: 35 IU/L (ref 0–40)
Albumin/Globulin Ratio: 1.3 (ref 1.2–2.2)
Albumin: 4.3 g/dL (ref 3.8–4.8)
Alkaline Phosphatase: 110 IU/L (ref 39–117)
BUN/Creatinine Ratio: 8 — ABNORMAL LOW (ref 10–24)
BUN: 9 mg/dL (ref 8–27)
Bilirubin Total: 0.4 mg/dL (ref 0.0–1.2)
CO2: 26 mmol/L (ref 20–29)
Calcium: 9.6 mg/dL (ref 8.6–10.2)
Chloride: 90 mmol/L — ABNORMAL LOW (ref 96–106)
Creatinine, Ser: 1.13 mg/dL (ref 0.76–1.27)
GFR calc Af Amer: 78 mL/min/{1.73_m2} (ref >59)
GFR calc non Af Amer: 67 mL/min/{1.73_m2} (ref >59)
Globulin, Total: 3.4 g/dL (ref 1.5–4.5)
Glucose: 113 mg/dL — ABNORMAL HIGH (ref 65–99)
Potassium: 3.2 mmol/L — ABNORMAL LOW (ref 3.5–5.2)
Sodium: 135 mmol/L (ref 134–144)
Total Protein: 7.7 g/dL (ref 6.0–8.5)

## 2019-07-09 MED ORDER — POTASSIUM CHLORIDE CRYS ER 20 MEQ PO TBCR: 20.0000 meq | EXTENDED_RELEASE_TABLET | Freq: Two times a day (BID) | ORAL | 0 refills | Status: DC

## 2019-07-09 NOTE — Telephone Encounter (Signed)
Pt aware.

## 2019-10-14 ENCOUNTER — Other Ambulatory Visit: Payer: Self-pay | Admitting: Family Medicine

## 2019-10-14 DIAGNOSIS — I10 Essential (primary) hypertension: Secondary | ICD-10-CM

## 2019-10-14 DIAGNOSIS — E876 Hypokalemia: Secondary | ICD-10-CM

## 2020-01-09 ENCOUNTER — Other Ambulatory Visit: Payer: Self-pay | Admitting: Family Medicine

## 2020-01-09 DIAGNOSIS — I10 Essential (primary) hypertension: Secondary | ICD-10-CM

## 2020-01-14 ENCOUNTER — Other Ambulatory Visit: Payer: Self-pay | Admitting: Family Medicine

## 2020-01-14 DIAGNOSIS — E876 Hypokalemia: Secondary | ICD-10-CM

## 2020-01-14 DIAGNOSIS — I10 Essential (primary) hypertension: Secondary | ICD-10-CM

## 2020-01-17 ENCOUNTER — Other Ambulatory Visit: Payer: Self-pay | Admitting: Family Medicine

## 2020-01-17 DIAGNOSIS — E782 Mixed hyperlipidemia: Secondary | ICD-10-CM

## 2020-01-17 DIAGNOSIS — I10 Essential (primary) hypertension: Secondary | ICD-10-CM

## 2020-02-13 ENCOUNTER — Other Ambulatory Visit: Payer: Self-pay | Admitting: Family Medicine

## 2020-02-13 DIAGNOSIS — I10 Essential (primary) hypertension: Secondary | ICD-10-CM

## 2020-02-13 DIAGNOSIS — E782 Mixed hyperlipidemia: Secondary | ICD-10-CM

## 2020-02-13 NOTE — Telephone Encounter (Signed)
Patient aware and appt made 

## 2020-02-13 NOTE — Telephone Encounter (Signed)
Patient last seen 07/08/2019. Patient needs to be seen.

## 2020-02-20 ENCOUNTER — Other Ambulatory Visit: Payer: Self-pay

## 2020-02-20 ENCOUNTER — Ambulatory Visit (INDEPENDENT_AMBULATORY_CARE_PROVIDER_SITE_OTHER): Payer: Medicare HMO | Admitting: Family Medicine

## 2020-02-20 ENCOUNTER — Encounter: Payer: Self-pay | Admitting: Family Medicine

## 2020-02-20 VITALS — BP 151/68 | HR 75 | Temp 99.3°F | Ht 68.0 in | Wt 196.0 lb

## 2020-02-20 DIAGNOSIS — Z789 Other specified health status: Secondary | ICD-10-CM | POA: Insufficient documentation

## 2020-02-20 DIAGNOSIS — E782 Mixed hyperlipidemia: Secondary | ICD-10-CM | POA: Diagnosis not present

## 2020-02-20 DIAGNOSIS — I10 Essential (primary) hypertension: Secondary | ICD-10-CM

## 2020-02-20 DIAGNOSIS — R7989 Other specified abnormal findings of blood chemistry: Secondary | ICD-10-CM | POA: Diagnosis not present

## 2020-02-20 DIAGNOSIS — Z7289 Other problems related to lifestyle: Secondary | ICD-10-CM | POA: Diagnosis not present

## 2020-02-20 DIAGNOSIS — F17200 Nicotine dependence, unspecified, uncomplicated: Secondary | ICD-10-CM

## 2020-02-20 DIAGNOSIS — R21 Rash and other nonspecific skin eruption: Secondary | ICD-10-CM

## 2020-02-20 DIAGNOSIS — E876 Hypokalemia: Secondary | ICD-10-CM

## 2020-02-20 DIAGNOSIS — R69 Illness, unspecified: Secondary | ICD-10-CM | POA: Diagnosis not present

## 2020-02-20 MED ORDER — AMLODIPINE BESYLATE 10 MG PO TABS: 10.0000 mg | ORAL_TABLET | Freq: Every day | ORAL | 3 refills | Status: DC

## 2020-02-20 MED ORDER — CHLORTHALIDONE 25 MG PO TABS: 25.0000 mg | ORAL_TABLET | Freq: Every day | ORAL | 3 refills | Status: DC

## 2020-02-20 MED ORDER — PRAVASTATIN SODIUM 80 MG PO TABS: 80.0000 mg | ORAL_TABLET | Freq: Every day | ORAL | 3 refills | Status: DC

## 2020-02-20 NOTE — Patient Instructions (Addendum)
You had labs performed today.  You will be contacted with the results of the labs once they are available, usually in the next 3 business days for routine lab work.  If you have an active my chart account, they will be released to your MyChart.  If you prefer to have these labs released to you via telephone, please let us know.  If you had a pap smear or biopsy performed, expect to be contacted in about 7-10 days.  Alcohol Use Disorder Alcohol use disorder is when your drinking disrupts your daily life. When you have this condition, you drink too much alcohol and you cannot control your drinking. Alcohol use disorder can cause serious problems with your physical health. It can affect your brain, heart, liver, pancreas, immune system, stomach, and intestines. Alcohol use disorder can increase your risk for certain cancers and cause problems with your mental health, such as depression, anxiety, psychosis, delirium, and dementia. People with this disorder risk hurting themselves and others. What are the causes? This condition is caused by drinking too much alcohol over time. It is not caused by drinking too much alcohol only one or two times. Some people with this condition drink alcohol to cope with or escape from negative life events. Others drink to relieve pain or symptoms of mental illness. What increases the risk? You are more likely to develop this condition if:  You have a family history of alcohol use disorder.  Your culture encourages drinking to the point of intoxication, or makes alcohol easy to get.  You had a mood or conduct disorder in childhood.  You have been a victim of abuse.  You are an adolescent and: ? You have poor grades or difficulties in school. ? Your caregivers do not talk to you about saying no to alcohol, or supervise your activities. ? You are impulsive or you have trouble with self-control. What are the signs or symptoms? Symptoms of this condition  include:  Drinkingmore than you want to.  Drinking for longer than you want to.  Trying several times to drink less or to control your drinking.  Spending a lot of time getting alcohol, drinking, or recovering from drinking.  Craving alcohol.  Having problems at work, at school, or at home due to drinking.  Having problems in relationships due to drinking.  Drinking when it is dangerous to drink, such as before driving a car.  Continuing to drink even though you know you might have a physical or mental problem related to drinking.  Needing more and more alcohol to get the same effect you want from the alcohol (building up tolerance).  Having symptoms of withdrawal when you stop drinking. Symptoms of withdrawal include: ? Fatigue. ? Nightmares. ? Trouble sleeping. ? Depression. ? Anxiety. ? Fever. ? Seizures. ? Severe confusion. ? Feeling or seeing things that are not there (hallucinations). ? Tremors. ? Rapid heart rate. ? Rapid breathing. ? High blood pressure.  Drinking to avoid symptoms of withdrawal. How is this diagnosed? This condition is diagnosed with an assessment. Your health care provider may start the assessment by asking three or four questions about your drinking. Your health care provider may perform a physical exam or do lab tests to see if you have physical problems resulting from alcohol use. She or he may refer you to a mental health professional for evaluation. How is this treated? Some people with alcohol use disorder are able to reduce their alcohol use to low-risk levels. Others need  to completely quit drinking alcohol. When necessary, mental health professionals with specialized training in substance use treatment can help. Your health care provider can help you decide how severe your alcohol use disorder is and what type of treatment you need. The following forms of treatment are available:  Detoxification. Detoxification involves quitting  drinking and using prescription medicines within the first week to help lessen withdrawal symptoms. This treatment is important for people who have had withdrawal symptoms before and for heavy drinkers who are likely to have withdrawal symptoms. Alcohol withdrawal can be dangerous, and in severe cases, it can cause death. Detoxification may be provided in a home, community, or primary care setting, or in a hospital or substance use treatment facility.  Counseling. This treatment is also called talk therapy. It is provided by substance use treatment counselors. A counselor can address the reasons you use alcohol and suggest ways to keep you from drinking again or to prevent problem drinking. The goals of talk therapy are to: ? Find healthy activities and ways for you to cope with stress. ? Identify and avoid the things that trigger your alcohol use. ? Help you learn how to handle cravings.  Medicines.Medicines can help treat alcohol use disorder by: ? Decreasing alcohol cravings. ? Decreasing the positive feeling you have when you drink alcohol. ? Causing an uncomfortable physical reaction when you drink alcohol (aversion therapy).  Support groups. Support groups are led by people who have quit drinking. They provide emotional support, advice, and guidance. These forms of treatment are often combined. Some people with this condition benefit from a combination of treatments provided by specialized substance use treatment centers. Follow these instructions at home:  Take over-the-counter and prescription medicines only as told by your health care provider.  Check with your health care provider before starting any new medicines.  Ask friends and family members not to offer you alcohol.  Avoid situations where alcohol is served, including gatherings where others are drinking alcohol.  Create a plan for what to do when you are tempted to use alcohol.  Find hobbies or activities that you enjoy  that do not include alcohol.  Keep all follow-up visits as told by your health care provider. This is important. How is this prevented?  If you drink, limit alcohol intake to no more than 1 drink a day for nonpregnant women and 2 drinks a day for men. One drink equals 12 oz of beer, 5 oz of wine, or 1 oz of hard liquor.  If you have a mental health condition, get treatment and support.  Do not give alcohol to adolescents.  If you are an adolescent: ? Do not drink alcohol. ? Do not be afraid to say no if someone offers you alcohol. Speak up about why you do not want to drink. You can be a positive role model for your friends and set a good example for those around you by not drinking alcohol. ? If your friends drink, spend time with others who do not drink alcohol. Make new friends who do not use alcohol. ? Find healthy ways to manage stress and emotions, such as meditation or deep breathing, exercise, spending time in nature, listening to music, or talking with a trusted friend or family member. Contact a health care provider if:  You are not able to take your medicines as told.  Your symptoms get worse.  You return to drinking alcohol (relapse) and your symptoms get worse. Get help right away if:  You have thoughts about hurting yourself or others. If you ever feel like you may hurt yourself or others, or have thoughts about taking your own life, get help right away. You can go to your nearest emergency department or call:  Your local emergency services (911 in the [place]  A suicide crisis helpline, such as the National Suicide Prevention Lifeline at (364)185-5730. This is open 24 hours a day. Summary  Alcohol use disorder is when your drinking disrupts your daily life. When you have this condition, you drink too much alcohol and you cannot control your drinking.  Treatment may include detoxification, counseling, medicine, and support groups.  Ask friends and family members  not to offer you alcohol. Avoid situations where alcohol is served.  Get help right away if you have thoughts about hurting yourself or others. This information is not intended to replace advice given to you by your health care provider. Make sure you discuss any questions you have with your health care provider. Document Revised: 10/26/2017 Document Reviewed: 08/10/2016 Elsevier Patient Education  2020 [employer].

## 2020-02-20 NOTE — Progress Notes (Signed)
Subjective: CC: f/u LFTs, hypertension, hyperlipidemia PCP: Janora Norlander, DO NBV:APOLIDC Daniel Baxter is a 69 y.o. male presenting to clinic today for:  1.  Hypertension, hyperlipidemia, tobacco use disorder Patient reports compliance with his Norvasc 10 mg, chlorthalidone 25 mg (skipped this am because didn't want to be running to [place]), potassium 20 mEq and Pravachol 80 mg.  Denies chest pain, shortness of breath, dizziness, headache or lower extremity edema.  He does report ongoing tobacco use and is back up to 1 pack/day.  He also continues to drink 6-7 beers per night.  Denies any nausea, vomiting, abdominal pain.  2.  Rash Patient reports a rash on his forehead that is mildly itchy.  He thinks it is a sunburn or maybe even windburn but wanted me to have a look at it today.  No therapies used thus far.    ROS: Per HPI  Allergies  Allergen Reactions  . Lisinopril Anaphylaxis    angioedema   Past Medical History:  Diagnosis Date  . Hyperlipidemia   . Hypertension     Current Outpatient Medications:  .  amLODipine (NORVASC) 10 MG tablet, Take 1 tablet (10 mg total) by mouth daily. (Needs to be seen before next refill), Disp: 30 tablet, Rfl: 0 .  chlorthalidone (HYGROTON) 25 MG tablet, TAKE 1 TABLET BY MOUTH EVERY DAY, Disp: 90 tablet, Rfl: 0 .  omeprazole (PRILOSEC) 20 MG capsule, Take 1 capsule (20 mg total) by mouth daily., Disp: 90 capsule, Rfl: 3 .  Potassium Chloride ER 20 MEQ TBCR, TAKE 1 TABLET BY MOUTH EVERY DAY, Disp: 90 tablet, Rfl: 0 .  pravastatin (PRAVACHOL) 80 MG tablet, Take 1 tablet (80 mg total) by mouth daily. (Needs to be seen before next refill), Disp: 30 tablet, Rfl: 0 Social History   Socioeconomic History  . Marital status: Married    Spouse name: Not on file  . Number of children: Not on file  . Years of education: Not on file  . Highest education level: Not on file  Occupational History  . Not on file  Tobacco Use  . Smoking status: Current  Every Day Smoker    Packs/day: 0.25    Types: Cigarettes  . Smokeless tobacco: Never Used  Substance and Sexual Activity  . Alcohol use: No  . Drug use: No  . Sexual activity: Not Currently  Other Topics Concern  . Not on file  Social History Narrative  . Not on file   Social Determinants of Health   Financial Resource Strain:   . Difficulty of Paying Living Expenses:   Food Insecurity:   . Worried About Charity fundraiser in the Last Year:   . Arboriculturist in the Last Year:   Transportation Needs:   . Film/video editor (Medical):   Marland Kitchen Lack of Transportation (Non-Medical):   Physical Activity:   . Days of Exercise per Week:   . Minutes of Exercise per Session:   Stress:   . Feeling of Stress :   Social Connections:   . Frequency of Communication with Friends and Family:   . Frequency of Social Gatherings with Friends and Family:   . Attends Religious Services:   . Active Member of Clubs or Organizations:   . Attends Archivist Meetings:   Marland Kitchen Marital Status:   Intimate Partner Violence:   . Fear of Current or Ex-Partner:   . Emotionally Abused:   Marland Kitchen Physically Abused:   . Sexually Abused:  History reviewed. No pertinent family history.  Objective: Office vital signs reviewed. BP (!) 151/68   Pulse 75   Temp 99.3 F (37.4 C) (Temporal)   Ht 5' 8"  (1.727 m)   Wt 196 lb (88.9 kg)   SpO2 96%   BMI 29.80 kg/m   Physical Examination:  General: Awake, alert, disheveled appearing. No acute distress HEENT: Normal.  Sclera mildly injected.  No scleral icterus.  Mucous membranes moist. Cardio: regular rate and rhythm, S1S2 heard, no murmurs appreciated Pulm: Globally decreased breath sounds but otherwise clear to auscultation bilaterally.  Prolonged expiratory phase noted.  Normal breathing on room air Skin: He has a mildly erythematous but blanching rash noted along the forehead with scant peeling.  Assessment/ Plan: 69 y.o. male   1. Essential  hypertension Not controlled but patient did not take his chlorthalidone this morning.  Of asked that he return in 2 weeks for blood pressure recheck.  Collect nonfasting lipid panel - CMP14+EGFR - Lipid Panel - amLODipine (NORVASC) 10 MG tablet; Take 1 tablet (10 mg total) by mouth daily.  Dispense: 90 tablet; Refill: 3 - chlorthalidone (HYGROTON) 25 MG tablet; Take 1 tablet (25 mg total) by mouth daily.  Dispense: 90 tablet; Refill: 3  2. Hypokalemia Continue potassium supplementation. - CMP14+EGFR  3. Mixed hyperlipidemia Nonfasting lipid panel ordered - CMP14+EGFR - Lipid Panel - pravastatin (PRAVACHOL) 80 MG tablet; Take 1 tablet (80 mg total) by mouth daily.  Dispense: 90 tablet; Refill: 3  4. Elevated liver function tests Likely secondary to alcohol use disorder - CMP14+EGFR  5. Tobacco use disorder Counseling performed  6. Alcohol use Counseling performed  7. Rash Hydration encouraged.  A sample of CeraVe was given today.  Encourage use of aloe.  Sunblock.   No orders of the defined types were placed in this encounter.  No orders of the defined types were placed in this encounter.    Janora Norlander, DO [place] [phone]

## 2020-02-21 LAB — CMP14+EGFR
ALT: 52 IU/L — ABNORMAL HIGH (ref 0–44)
AST: 43 IU/L — ABNORMAL HIGH (ref 0–40)
Albumin/Globulin Ratio: 1.2 (ref 1.2–2.2)
Albumin: 4.1 g/dL (ref 3.8–4.8)
Alkaline Phosphatase: 118 IU/L — ABNORMAL HIGH (ref 39–117)
BUN/Creatinine Ratio: 7 — ABNORMAL LOW (ref 10–24)
BUN: 8 mg/dL (ref 8–27)
Bilirubin Total: 0.4 mg/dL (ref 0.0–1.2)
CO2: 26 mmol/L (ref 20–29)
Calcium: 9.8 mg/dL (ref 8.6–10.2)
Chloride: 93 mmol/L — ABNORMAL LOW (ref 96–106)
Creatinine, Ser: 1.07 mg/dL (ref 0.76–1.27)
GFR calc Af Amer: 83 mL/min/{1.73_m2} (ref >59)
GFR calc non Af Amer: 72 mL/min/{1.73_m2} (ref >59)
Globulin, Total: 3.4 g/dL (ref 1.5–4.5)
Glucose: 107 mg/dL — ABNORMAL HIGH (ref 65–99)
Potassium: 3.2 mmol/L — ABNORMAL LOW (ref 3.5–5.2)
Sodium: 136 mmol/L (ref 134–144)
Total Protein: 7.5 g/dL (ref 6.0–8.5)

## 2020-02-21 LAB — LIPID PANEL
Chol/HDL Ratio: 3.1 ratio (ref 0.0–5.0)
Cholesterol, Total: 135 mg/dL (ref 100–199)
HDL: 43 mg/dL (ref >39)
LDL Chol Calc (NIH): 72 mg/dL (ref 0–99)
Triglycerides: 110 mg/dL (ref 0–149)
VLDL Cholesterol Cal: 20 mg/dL (ref 5–40)

## 2020-02-25 ENCOUNTER — Telehealth: Payer: Self-pay | Admitting: Family Medicine

## 2020-02-25 NOTE — Telephone Encounter (Signed)
In result notes.  

## 2020-04-11 ENCOUNTER — Other Ambulatory Visit: Payer: Self-pay | Admitting: Family Medicine

## 2020-04-11 DIAGNOSIS — I10 Essential (primary) hypertension: Secondary | ICD-10-CM

## 2020-04-11 DIAGNOSIS — E876 Hypokalemia: Secondary | ICD-10-CM

## 2020-06-22 ENCOUNTER — Other Ambulatory Visit: Payer: Self-pay | Admitting: Family Medicine

## 2020-06-22 DIAGNOSIS — I10 Essential (primary) hypertension: Secondary | ICD-10-CM

## 2020-06-22 DIAGNOSIS — E876 Hypokalemia: Secondary | ICD-10-CM

## 2020-07-06 ENCOUNTER — Other Ambulatory Visit: Payer: Self-pay | Admitting: Family Medicine

## 2020-07-28 ENCOUNTER — Other Ambulatory Visit: Payer: Self-pay | Admitting: Family Medicine

## 2020-07-29 NOTE — Telephone Encounter (Signed)
Patient last seen in March. Patient needs to be seen.

## 2020-07-30 NOTE — Telephone Encounter (Signed)
Attempted to contact patient - NA- left message for patient to call back to schedule an appointment for medication refill

## 2020-10-07 ENCOUNTER — Other Ambulatory Visit: Payer: Self-pay | Admitting: Family Medicine

## 2020-10-07 DIAGNOSIS — I10 Essential (primary) hypertension: Secondary | ICD-10-CM

## 2020-10-07 DIAGNOSIS — E876 Hypokalemia: Secondary | ICD-10-CM

## 2020-10-31 ENCOUNTER — Other Ambulatory Visit: Payer: Self-pay | Admitting: Family Medicine

## 2020-10-31 DIAGNOSIS — E876 Hypokalemia: Secondary | ICD-10-CM

## 2020-10-31 DIAGNOSIS — I10 Essential (primary) hypertension: Secondary | ICD-10-CM

## 2020-11-03 ENCOUNTER — Telehealth: Payer: Medicare HMO

## 2020-11-03 DIAGNOSIS — E876 Hypokalemia: Secondary | ICD-10-CM

## 2020-11-03 DIAGNOSIS — I10 Essential (primary) hypertension: Secondary | ICD-10-CM

## 2020-11-03 MED ORDER — POTASSIUM CHLORIDE ER 20 MEQ PO TBCR: EXTENDED_RELEASE_TABLET | ORAL | 1 refills | Status: DC

## 2020-11-03 NOTE — Telephone Encounter (Signed)
  Prescription Request  11/03/2020  What is the name of the medication or equipment? Potassium Chloride ER 20 MEQ TBCR  Have you contacted your pharmacy to request a refill? (if applicable) no new rx ntbs scheduled apt for 12/08/2020 w Nadine Counts at 2:00  Which pharmacy would you like this sent to? [facility]   Patient notified that their request is being sent to the clinical staff for review and that they should receive a response within 2 business days.

## 2020-11-03 NOTE — Telephone Encounter (Signed)
Sent refill to pharmacy. Patient notified

## 2020-12-08 ENCOUNTER — Ambulatory Visit: Payer: Medicare HMO | Admitting: Family Medicine

## 2021-01-14 ENCOUNTER — Other Ambulatory Visit: Payer: Self-pay

## 2021-01-14 ENCOUNTER — Ambulatory Visit (INDEPENDENT_AMBULATORY_CARE_PROVIDER_SITE_OTHER): Payer: Medicare HMO | Admitting: Family Medicine

## 2021-01-14 ENCOUNTER — Encounter: Payer: Self-pay | Admitting: Family Medicine

## 2021-01-14 VITALS — BP 158/69 | HR 79 | Temp 97.1°F | Ht 68.0 in | Wt 199.0 lb

## 2021-01-14 DIAGNOSIS — F17200 Nicotine dependence, unspecified, uncomplicated: Secondary | ICD-10-CM | POA: Diagnosis not present

## 2021-01-14 DIAGNOSIS — I10 Essential (primary) hypertension: Secondary | ICD-10-CM

## 2021-01-14 DIAGNOSIS — R69 Illness, unspecified: Secondary | ICD-10-CM | POA: Diagnosis not present

## 2021-01-14 DIAGNOSIS — E876 Hypokalemia: Secondary | ICD-10-CM | POA: Diagnosis not present

## 2021-01-14 DIAGNOSIS — R7989 Other specified abnormal findings of blood chemistry: Secondary | ICD-10-CM

## 2021-01-14 DIAGNOSIS — R454 Irritability and anger: Secondary | ICD-10-CM

## 2021-01-14 DIAGNOSIS — E782 Mixed hyperlipidemia: Secondary | ICD-10-CM

## 2021-01-14 MED ORDER — OMEPRAZOLE 20 MG PO CPDR: 20.0000 mg | DELAYED_RELEASE_CAPSULE | Freq: Every day | ORAL | 3 refills | Status: DC

## 2021-01-14 MED ORDER — PRAVASTATIN SODIUM 80 MG PO TABS: 80.0000 mg | ORAL_TABLET | Freq: Every day | ORAL | 3 refills | Status: DC

## 2021-01-14 MED ORDER — POTASSIUM CHLORIDE ER 20 MEQ PO TBCR: EXTENDED_RELEASE_TABLET | ORAL | 3 refills | Status: DC

## 2021-01-14 MED ORDER — AMLODIPINE BESYLATE 10 MG PO TABS: 10.0000 mg | ORAL_TABLET | Freq: Every day | ORAL | 3 refills | Status: DC

## 2021-01-14 MED ORDER — CHLORTHALIDONE 25 MG PO TABS: 25.0000 mg | ORAL_TABLET | Freq: Every day | ORAL | 3 refills | Status: DC

## 2021-01-14 NOTE — Patient Instructions (Signed)
Take your blood pressure medication. Check your blood pressures and bring a copy of the readings to the office in 2 weeks.   How to Take Your Blood Pressure Blood pressure measures how strongly your blood is pressing against the walls of your arteries. Arteries are blood vessels that carry blood from your heart throughout your body. You can take your blood pressure at home with a machine. You may need to check your blood pressure at home:  To check if you have high blood pressure (hypertension).  To check your blood pressure over time.  To make sure your blood pressure medicine is working. Supplies needed:  Blood pressure machine, or monitor.  Dining room chair to sit in.  Table or desk.  Small notebook.  Pencil or pen. How to prepare Avoid these things for 30 minutes before checking your blood pressure:  Having drinks with caffeine in them, such as coffee or tea.  Drinking alcohol.  Eating.  Smoking.  Exercising. Do these things five minutes before checking your blood pressure:  Go to the bathroom and pee (urinate).  Sit in a dining chair. Do not sit in a soft couch or an armchair.  Be quiet. Do not talk. How to take your blood pressure Follow the instructions that came with your machine. If you have a digital blood pressure monitor, these may be the instructions: 1. Sit up straight. 2. Place your feet on the floor. Do not cross your ankles or legs. 3. Rest your left arm at the level of your heart. You may rest it on a table, desk, or chair. 4. Pull up your shirt sleeve. 5. Wrap the blood pressure cuff around the upper part of your left arm. The cuff should be 1 inch (2.5 cm) above your elbow. It is best to wrap the cuff around bare skin. 6. Fit the cuff snugly around your arm. You should be able to place only one finger between the cuff and your arm. 7. Place the cord so that it rests in the bend of your elbow. 8. Press the power button. 9. Sit quietly while the  cuff fills with air and loses air. 10. Write down the numbers on the screen. 11. Wait 2-3 minutes and then repeat steps 1-10.   What do the numbers mean? Two numbers make up your blood pressure. The first number is called systolic pressure. The second is called diastolic pressure. An example of a blood pressure reading is "120 over 80" (or 120/80). If you are an adult and do not have a medical condition, use this guide to find out if your blood pressure is normal: Normal  First number: below 120.  Second number: below 80. Elevated  First number: 120-129.  Second number: below 80. Hypertension stage 1  First number: 130-139.  Second number: 80-89. Hypertension stage 2  First number: 140 or above.  Second number: 90 or above. Your blood pressure is above normal even if only the top or bottom number is above normal. Follow these instructions at home:  Check your blood pressure as often as your doctor tells you to.  Check your blood pressure at the same time every day.  Take your monitor to your next doctor's appointment. Your doctor will: ? Make sure you are using it correctly. ? Make sure it is working right.  Make sure you understand what your blood pressure numbers should be.  Tell your doctor if your medicine is causing side effects.  Keep all follow-up visits as  told by your doctor. This is important. General tips:  You will need a blood pressure machine, or monitor. Your doctor can suggest a monitor. You can buy one at a drugstore or online. When choosing one: ? Choose one with an arm cuff. ? Choose one that wraps around your upper arm. Only one finger should fit between your arm and the cuff. ? Do not choose one that measures your blood pressure from your wrist or finger. Where to find more information American Heart Association: www.heart.org Contact a doctor if:  Your blood pressure keeps being high. Get help right away if:  Your first blood pressure  number is higher than 180.  Your second blood pressure number is higher than 120. Summary  Check your blood pressure at the same time every day.  Avoid caffeine, alcohol, smoking, and exercise for 30 minutes before checking your blood pressure.  Make sure you understand what your blood pressure numbers should be. This information is not intended to replace advice given to you by your health care provider. Make sure you discuss any questions you have with your health care provider. Document Revised: 11/07/2019 Document Reviewed: 11/07/2019 Elsevier Patient Education  2021 Elsevier Inc.  https://www.mata.com/.pdf">  DASH Eating Plan DASH stands for Dietary Approaches to Stop Hypertension. The DASH eating plan is a healthy eating plan that has been shown to:  Reduce high blood pressure (hypertension).  Reduce your risk for type 2 diabetes, heart disease, and stroke.  Help with weight loss. What are tips for following this plan? Reading food labels  Check food labels for the amount of salt (sodium) per serving. Choose foods with less than 5 percent of the Daily Value of sodium. Generally, foods with less than 300 milligrams (mg) of sodium per serving fit into this eating plan.  To find whole grains, look for the word "whole" as the first word in the ingredient list. Shopping  Buy products labeled as "low-sodium" or "no salt added."  Buy fresh foods. Avoid canned foods and pre-made or frozen meals. Cooking  Avoid adding salt when cooking. Use salt-free seasonings or herbs instead of table salt or sea salt. Check with your health care provider or pharmacist before using salt substitutes.  Do not fry foods. Cook foods using healthy methods such as baking, boiling, grilling, roasting, and broiling instead.  Cook with heart-healthy oils, such as olive, canola, avocado, soybean, or sunflower oil. Meal planning  Eat a balanced diet that  includes: ? 4 or more servings of fruits and 4 or more servings of vegetables each day. Try to fill one-half of your plate with fruits and vegetables. ? 6-8 servings of whole grains each day. ? Less than 6 oz (170 g) of lean meat, poultry, or fish each day. A 3-oz (85-g) serving of meat is about the same size as a deck of cards. One egg equals 1 oz (28 g). ? 2-3 servings of low-fat dairy each day. One serving is 1 cup (237 mL). ? 1 serving of nuts, seeds, or beans 5 times each week. ? 2-3 servings of heart-healthy fats. Healthy fats called omega-3 fatty acids are found in foods such as walnuts, flaxseeds, fortified milks, and eggs. These fats are also found in cold-water fish, such as sardines, salmon, and mackerel.  Limit how much you eat of: ? Canned or prepackaged foods. ? Food that is high in trans fat, such as some fried foods. ? Food that is high in saturated fat, such as fatty meat. ?  Desserts and other sweets, sugary drinks, and other foods with added sugar. ? Full-fat dairy products.  Do not salt foods before eating.  Do not eat more than 4 egg yolks a week.  Try to eat at least 2 vegetarian meals a week.  Eat more home-cooked food and less restaurant, buffet, and fast food.   Lifestyle  When eating at a restaurant, ask that your food be prepared with less salt or no salt, if possible.  If you drink alcohol: ? Limit how much you use to:  0-1 drink a day for women who are not pregnant.  0-2 drinks a day for men. ? Be aware of how much alcohol is in your drink. In the [place] one drink equals one 12 oz bottle of beer (355 mL), one 5 oz glass of wine (148 mL), or one 1 oz glass of hard liquor (44 mL). General information  Avoid eating more than 2,300 mg of salt a day. If you have hypertension, you may need to reduce your sodium intake to 1,500 mg a day.  Work with your health care provider to maintain a healthy body weight or to lose weight. Ask what an ideal weight is for  you.  Get at least 30 minutes of exercise that causes your heart to beat faster (aerobic exercise) most days of the week. Activities may include walking, swimming, or biking.  Work with your health care provider or dietitian to adjust your eating plan to your individual calorie needs. What foods should I eat? Fruits All fresh, dried, or frozen fruit. Canned fruit in natural juice (without added sugar). Vegetables Fresh or frozen vegetables (raw, steamed, roasted, or grilled). Low-sodium or reduced-sodium tomato and vegetable juice. Low-sodium or reduced-sodium tomato sauce and tomato paste. Low-sodium or reduced-sodium canned vegetables. Grains Whole-grain or whole-wheat bread. Whole-grain or whole-wheat pasta. Brown rice. Orpah Cobb. Bulgur. Whole-grain and low-sodium cereals. Pita bread. Low-fat, low-sodium crackers. Whole-wheat flour tortillas. Meats and other proteins Skinless chicken or [place]. Ground chicken or [place]. Pork with fat trimmed off. Fish and seafood. Egg whites. Dried beans, peas, or lentils. Unsalted nuts, nut butters, and seeds. Unsalted canned beans. Lean cuts of beef with fat trimmed off. Low-sodium, lean precooked or cured meat, such as sausages or meat loaves. Dairy Low-fat (1%) or fat-free (skim) milk. Reduced-fat, low-fat, or fat-free cheeses. Nonfat, low-sodium ricotta or cottage cheese. Low-fat or nonfat yogurt. Low-fat, low-sodium cheese. Fats and oils Soft margarine without trans fats. Vegetable oil. Reduced-fat, low-fat, or light mayonnaise and salad dressings (reduced-sodium). Canola, safflower, olive, avocado, soybean, and sunflower oils. Avocado. Seasonings and condiments Herbs. Spices. Seasoning mixes without salt. Other foods Unsalted popcorn and pretzels. Fat-free sweets. The items listed above may not be a complete list of foods and beverages you can eat. Contact a dietitian for more information. What foods should I avoid? Fruits Canned fruit in a  light or heavy syrup. Fried fruit. Fruit in cream or butter sauce. Vegetables Creamed or fried vegetables. Vegetables in a cheese sauce. Regular canned vegetables (not low-sodium or reduced-sodium). Regular canned tomato sauce and paste (not low-sodium or reduced-sodium). Regular tomato and vegetable juice (not low-sodium or reduced-sodium). Rosita Fire. Olives. Grains Baked goods made with fat, such as croissants, muffins, or some breads. Dry pasta or rice meal packs. Meats and other proteins Fatty cuts of meat. Ribs. Fried meat. Tomasa Blase. Bologna, salami, and other precooked or cured meats, such as sausages or meat loaves. Fat from the back of a pig (fatback). Bratwurst. Salted nuts and  seeds. Canned beans with added salt. Canned or smoked fish. Whole eggs or egg yolks. Chicken or [place] with skin. Dairy Whole or 2% milk, cream, and half-and-half. Whole or full-fat cream cheese. Whole-fat or sweetened yogurt. Full-fat cheese. Nondairy creamers. Whipped toppings. Processed cheese and cheese spreads. Fats and oils Butter. Stick margarine. Lard. Shortening. Ghee. Bacon fat. Tropical oils, such as coconut, palm kernel, or palm oil. Seasonings and condiments Onion salt, garlic salt, seasoned salt, table salt, and sea salt. Worcestershire sauce. Tartar sauce. Barbecue sauce. Teriyaki sauce. Soy sauce, including reduced-sodium. Steak sauce. Canned and packaged gravies. Fish sauce. Oyster sauce. Cocktail sauce. Store-bought horseradish. Ketchup. Mustard. Meat flavorings and tenderizers. Bouillon cubes. Hot sauces. Pre-made or packaged marinades. Pre-made or packaged taco seasonings. Relishes. Regular salad dressings. Other foods Salted popcorn and pretzels. The items listed above may not be a complete list of foods and beverages you should avoid. Contact a dietitian for more information. Where to find more information  National Heart, Lung, and Blood Institute: PopSteam.is  American Heart Association:  www.heart.org  Academy of Nutrition and Dietetics: www.eatright.org  National Kidney Foundation: www.kidney.org Summary  The DASH eating plan is a healthy eating plan that has been shown to reduce high blood pressure (hypertension). It may also reduce your risk for type 2 diabetes, heart disease, and stroke.  When on the DASH eating plan, aim to eat more fresh fruits and vegetables, whole grains, lean proteins, low-fat dairy, and heart-healthy fats.  With the DASH eating plan, you should limit salt (sodium) intake to 2,300 mg a day. If you have hypertension, you may need to reduce your sodium intake to 1,500 mg a day.  Work with your health care provider or dietitian to adjust your eating plan to your individual calorie needs. This information is not intended to replace advice given to you by your health care provider. Make sure you discuss any questions you have with your health care provider. Document Revised: 10/17/2019 Document Reviewed: 10/17/2019 Elsevier Patient Education  2021 [employer].

## 2021-01-14 NOTE — Progress Notes (Signed)
Subjective: CC: HTN, HLD PCP: Janora Norlander, DO Daniel Baxter is a 70 y.o. male presenting to clinic today for:  1. HTN, HLD, tobacco use disorder Patient notes he needs refills on all of his medicines.  He uses the chlorthalidone only every other day.  No chest pain, shortness of breath.  Home BPs tend to be around 948 systolic.  He smokes daily.  No hemoptysis or unplanned weight loss  2.  Anger issues Patient reports he is easily angered, particularly with the neighbors that he has down the road messing up the gravel road which they share.  He is able to "control his temper though".  He does not feel like he needs interventions for this at this time   ROS: Per HPI  Allergies  Allergen Reactions  . Lisinopril Anaphylaxis    angioedema   Past Medical History:  Diagnosis Date  . Hyperlipidemia   . Hypertension     Current Outpatient Medications:  .  amLODipine (NORVASC) 10 MG tablet, Take 1 tablet (10 mg total) by mouth daily., Disp: 90 tablet, Rfl: 3 .  chlorthalidone (HYGROTON) 25 MG tablet, Take 1 tablet (25 mg total) by mouth daily., Disp: 90 tablet, Rfl: 3 .  omeprazole (PRILOSEC) 20 MG capsule, Take 1 capsule (20 mg total) by mouth daily. (Needs to be seen before next refill), Disp: 30 capsule, Rfl: 0 .  Potassium Chloride ER 20 MEQ TBCR, TAKE 1 TABLET BY MOUTH EVERY DAY (Needs to be seen before next refill), Disp: 30 tablet, Rfl: 1 .  pravastatin (PRAVACHOL) 80 MG tablet, Take 1 tablet (80 mg total) by mouth daily., Disp: 90 tablet, Rfl: 3 Social History   Socioeconomic History  . Marital status: Married    Spouse name: Not on file  . Number of children: Not on file  . Years of education: Not on file  . Highest education level: Not on file  Occupational History  . Not on file  Tobacco Use  . Smoking status: Current Every Day Smoker    Packs/day: 0.25    Types: Cigarettes  . Smokeless tobacco: Never Used  Vaping Use  . Vaping Use: Never used   Substance and Sexual Activity  . Alcohol use: No  . Drug use: No  . Sexual activity: Not Currently  Other Topics Concern  . Not on file  Social History Narrative  . Not on file   Social Determinants of Health   Financial Resource Strain: Not on file  Food Insecurity: Not on file  Transportation Needs: Not on file  Physical Activity: Not on file  Stress: Not on file  Social Connections: Not on file  Intimate Partner Violence: Not on file   No family history on file.  Objective: Office vital signs reviewed. BP (!) 158/69   Pulse 79   Temp (!) 97.1 F (36.2 C) (Temporal)   Ht _0  (1.727 m)   Wt 199 lb (90.3 kg)   SpO2 95%   BMI 30.26 kg/m   Physical Examination:  General: Awake, alert, well nourished, No acute distress HEENT: Normal; sclera white Cardio: regular rate and rhythm, S1S2 heard, no murmurs appreciated Pulm: Globally decreased breath sounds but otherwise clear to auscultation bilaterally with no wheezes, rhonchi or rales.  He has normal work of breathing on room air Extremities: warm, well perfused, trace ankle edema, No cyanosis or clubbing; +2 pulses bilaterally MSK: ambulates independently Psych: Mood stable, speech normal, affect appropriate.  Patient is pleasant and interactive  Depression screen [facility] 2/9 01/14/2021 02/20/2020 07/08/2019  Decreased Interest 2 2 0  Down, Depressed, Hopeless 1 1 0  PHQ - 2 Score 3 3 0  Altered sleeping 1 0 0  Tired, decreased energy 2 0 0  Change in appetite 0 0 0  Feeling bad or failure about yourself  0 0 0  Trouble concentrating 0 0 0  Moving slowly or fidgety/restless 0 0 0  Suicidal thoughts 0 0 0  PHQ-9 Score 6 3 0  Difficult doing work/chores Not difficult at all - -   GAD 7 : Generalized Anxiety Score 01/14/2021  Nervous, Anxious, on Edge 0  Control/stop worrying 0  Worry too much - different things 0  Restless 0  Easily annoyed or irritable 0  Afraid - awful might happen 0    Assessment/ Plan: 70  y.o. male   Essential hypertension - Plan: CMP14+EGFR, amLODipine (NORVASC) 10 MG tablet, chlorthalidone (HYGROTON) 25 MG tablet, Potassium Chloride ER 20 MEQ TBCR  Hypokalemia - Plan: CMP14+EGFR, Magnesium, Potassium Chloride ER 20 MEQ TBCR  Elevated liver function tests - Plan: CMP14+EGFR, LDL Cholesterol, Direct  Tobacco use disorder - Plan: CBC with Differential  Anger reaction  Mixed hyperlipidemia - Plan: pravastatin (PRAVACHOL) 80 MG tablet  Blood pressure is not controlled I suspect because he is been out of medicines.  Renewals of all meds sent.  Check renal function.  He will monitor blood pressures at home and drop off a 2-week summary.  If persistently elevated, we will plan to add either an ACE inhibitor or ARB  Recheck potassium given hypokalemia noted.  Add magnesium level  Liver enzymes noted to be elevated previously.  Possibly due to fatty liver disease.  Patient is nonfasting so direct LDL ordered  Counseled on tobacco use.  Precontemplative.  CBC ordered  Offered referral for counseling versus medication for mood stabilization but patient declined today  No orders of the defined types were placed in this encounter.  No orders of the defined types were placed in this encounter.    Janora Norlander, DO [place] [phone]

## 2021-01-15 LAB — CMP14+EGFR
ALT: 50 IU/L — ABNORMAL HIGH (ref 0–44)
AST: 44 IU/L — ABNORMAL HIGH (ref 0–40)
Albumin/Globulin Ratio: 1.2 (ref 1.2–2.2)
Albumin: 4.6 g/dL (ref 3.8–4.8)
Alkaline Phosphatase: 136 IU/L — ABNORMAL HIGH (ref 44–121)
BUN/Creatinine Ratio: 8 — ABNORMAL LOW (ref 10–24)
BUN: 9 mg/dL (ref 8–27)
Bilirubin Total: 0.3 mg/dL (ref 0.0–1.2)
CO2: 23 mmol/L (ref 20–29)
Calcium: 9.9 mg/dL (ref 8.6–10.2)
Chloride: 99 mmol/L (ref 96–106)
Creatinine, Ser: 1.06 mg/dL (ref 0.76–1.27)
GFR calc Af Amer: 84 mL/min/{1.73_m2} (ref >59)
GFR calc non Af Amer: 72 mL/min/{1.73_m2} (ref >59)
Globulin, Total: 3.7 g/dL (ref 1.5–4.5)
Glucose: 133 mg/dL — ABNORMAL HIGH (ref 65–99)
Potassium: 4.4 mmol/L (ref 3.5–5.2)
Sodium: 139 mmol/L (ref 134–144)
Total Protein: 8.3 g/dL (ref 6.0–8.5)

## 2021-01-15 LAB — CBC WITH DIFFERENTIAL/PLATELET
Basophils Absolute: 0.1 10*3/uL (ref 0.0–0.2)
Basos: 1 %
EOS (ABSOLUTE): 0.1 10*3/uL (ref 0.0–0.4)
Eos: 1 %
Hematocrit: 46.4 % (ref 37.5–51.0)
Hemoglobin: 15.6 g/dL (ref 13.0–17.7)
Immature Grans (Abs): 0 10*3/uL (ref 0.0–0.1)
Immature Granulocytes: 0 %
Lymphocytes Absolute: 1.8 10*3/uL (ref 0.7–3.1)
Lymphs: 21 %
MCH: 31.6 pg (ref 26.6–33.0)
MCHC: 33.6 g/dL (ref 31.5–35.7)
MCV: 94 fL (ref 79–97)
Monocytes Absolute: 0.9 10*3/uL (ref 0.1–0.9)
Monocytes: 10 %
Neutrophils Absolute: 5.8 10*3/uL (ref 1.4–7.0)
Neutrophils: 67 %
Platelets: 257 10*3/uL (ref 150–450)
RBC: 4.94 x10E6/uL (ref 4.14–5.80)
RDW: 13.6 % (ref 11.6–15.4)
WBC: 8.7 10*3/uL (ref 3.4–10.8)

## 2021-01-15 LAB — MAGNESIUM: Magnesium: 2.5 mg/dL — ABNORMAL HIGH (ref 1.6–2.3)

## 2021-01-15 LAB — LDL CHOLESTEROL, DIRECT: LDL Direct: 85 mg/dL (ref 0–99)

## 2021-11-09 ENCOUNTER — Telehealth: Payer: Self-pay | Admitting: Family Medicine

## 2021-11-09 NOTE — Telephone Encounter (Signed)
Left message for patient to call back and schedule Medicare Annual Wellness Visit (AWV) to be completed by video or phone.  ° ° °Please schedule at anytime with [facility] Nurse Health Advisor   Mary Jane ° °45 minute appointment ° °Any questions, please contact me at [phone]  °  °  °

## 2021-12-13 ENCOUNTER — Telehealth: Payer: Self-pay | Admitting: Family Medicine

## 2021-12-13 ENCOUNTER — Other Ambulatory Visit: Payer: Self-pay | Admitting: Family Medicine

## 2021-12-13 DIAGNOSIS — I10 Essential (primary) hypertension: Secondary | ICD-10-CM

## 2021-12-13 DIAGNOSIS — E782 Mixed hyperlipidemia: Secondary | ICD-10-CM

## 2021-12-13 DIAGNOSIS — E876 Hypokalemia: Secondary | ICD-10-CM

## 2021-12-13 NOTE — Telephone Encounter (Signed)
°  Left message for patient to call back and schedule Medicare Annual Wellness Visit (AWV) to be completed by video or phone. ° °No hx of AWV eligible for AWVI as of  04/28/2019 per [facility] ° °Please schedule at anytime with [facility] Nurse Health Advisor --- Mary Jane ° °45 Minutes appointment  ° °Any questions, please call me at [phone]   °

## 2022-01-03 ENCOUNTER — Telehealth: Payer: Self-pay | Admitting: Family Medicine

## 2022-01-03 NOTE — Telephone Encounter (Signed)
°  Left message for patient to call back and schedule Medicare Annual Wellness Visit (AWV) to be completed by video or phone.  No hx of AWV eligible for AWVI as of  04/28/2019 per [facility]  Please schedule at anytime with [facility] Nurse Health Advisor --- Germaine Pomfret  45 Minutes appointment   Any questions, please call me at [phone]

## 2022-01-13 ENCOUNTER — Ambulatory Visit: Payer: Medicare HMO | Admitting: Family Medicine

## 2022-02-02 ENCOUNTER — Telehealth: Payer: Self-pay | Admitting: Family Medicine

## 2022-02-02 NOTE — Telephone Encounter (Signed)
?  Left message for patient to call back and schedule Medicare Annual Wellness Visit (AWV) to be completed by video or phone.  No hx of AWV eligible for AWVI per [facility] as of 04/28/2019  Please schedule at anytime with [facility] Nurse Health Advisor --- Mary Jane  45 Minutes appointment   Any questions, please call me at [phone]   

## 2022-02-20 ENCOUNTER — Ambulatory Visit (INDEPENDENT_AMBULATORY_CARE_PROVIDER_SITE_OTHER): Payer: Medicare HMO | Admitting: Family Medicine

## 2022-02-20 ENCOUNTER — Encounter: Payer: Self-pay | Admitting: Family Medicine

## 2022-02-20 VITALS — BP 155/72 | HR 75 | Temp 98.4°F | Ht 68.0 in | Wt 196.4 lb

## 2022-02-20 DIAGNOSIS — E782 Mixed hyperlipidemia: Secondary | ICD-10-CM | POA: Diagnosis not present

## 2022-02-20 DIAGNOSIS — R69 Illness, unspecified: Secondary | ICD-10-CM | POA: Diagnosis not present

## 2022-02-20 DIAGNOSIS — F1010 Alcohol abuse, uncomplicated: Secondary | ICD-10-CM

## 2022-02-20 DIAGNOSIS — F17200 Nicotine dependence, unspecified, uncomplicated: Secondary | ICD-10-CM | POA: Diagnosis not present

## 2022-02-20 DIAGNOSIS — I10 Essential (primary) hypertension: Secondary | ICD-10-CM | POA: Diagnosis not present

## 2022-02-20 DIAGNOSIS — Z0001 Encounter for general adult medical examination with abnormal findings: Secondary | ICD-10-CM | POA: Diagnosis not present

## 2022-02-20 DIAGNOSIS — K219 Gastro-esophageal reflux disease without esophagitis: Secondary | ICD-10-CM | POA: Diagnosis not present

## 2022-02-20 DIAGNOSIS — E559 Vitamin D deficiency, unspecified: Secondary | ICD-10-CM

## 2022-02-20 DIAGNOSIS — R351 Nocturia: Secondary | ICD-10-CM

## 2022-02-20 DIAGNOSIS — Z125 Encounter for screening for malignant neoplasm of prostate: Secondary | ICD-10-CM | POA: Diagnosis not present

## 2022-02-20 DIAGNOSIS — Z0000 Encounter for general adult medical examination without abnormal findings: Secondary | ICD-10-CM

## 2022-02-20 MED ORDER — CHLORTHALIDONE 25 MG PO TABS: 25.0000 mg | ORAL_TABLET | Freq: Every day | ORAL | 3 refills | Status: DC

## 2022-02-20 MED ORDER — OMEPRAZOLE 20 MG PO CPDR: 20.0000 mg | DELAYED_RELEASE_CAPSULE | Freq: Every day | ORAL | 3 refills | Status: DC

## 2022-02-20 MED ORDER — AMLODIPINE BESYLATE 10 MG PO TABS: 10.0000 mg | ORAL_TABLET | Freq: Every day | ORAL | 3 refills | Status: DC

## 2022-02-20 MED ORDER — PRAVASTATIN SODIUM 80 MG PO TABS: 80.0000 mg | ORAL_TABLET | Freq: Every day | ORAL | 3 refills | Status: DC

## 2022-02-20 MED ORDER — POTASSIUM CHLORIDE ER 20 MEQ PO TBCR: 1.0000 | EXTENDED_RELEASE_TABLET | Freq: Every day | ORAL | 3 refills | Status: DC

## 2022-02-20 NOTE — Progress Notes (Signed)
? ?Daniel Baxter is a 71 y.o. male presents to office today for annual physical exam examination.   ? ?Concerns today include: ?1.  None ? ?Occupation: retired, Substance use: 1 pack/day.  Has been a smoker since age 61.  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  Has smoked anywhere from 1/2-1 full pack per day since.  Drinks 6-8 beers per day, [employer].  More if he can get it for free ?Diet: not restricted, Exercise: none ?Last eye exam: needs ?Last dental exam: needs ?Last colonoscopy: needs but declines ?Refills needed today: all ?Immunizations needed: ?Immunization History  ?Administered Date(s) Administered  ? Influenza-Unspecified 09/08/2010  ? Tdap 05/07/2014  ? ? ? ?Past Medical History:  ?Diagnosis Date  ? Hyperlipidemia   ? Hypertension   ? ?Social History  ? ?Socioeconomic History  ? Marital status: Married  ?  Spouse name: Not on file  ? Number of children: Not on file  ? Years of education: Not on file  ? Highest education level: Not on file  ?Occupational History  ? Not on file  ?Tobacco Use  ? Smoking status: Every Day  ?  Packs/day: 0.25  ?  Types: Cigarettes  ? Smokeless tobacco: Never  ?Vaping Use  ? Vaping Use: Never used  ?Substance and Sexual Activity  ? Alcohol use: No  ? Drug use: No  ? Sexual activity: Not Currently  ?Other Topics Concern  ? Not on file  ?Social History Narrative  ? Not on file  ? ?Social Determinants of Health  ? ?Financial Resource Strain: Not on file  ?Food Insecurity: Not on file  ?Transportation Needs: Not on file  ?Physical Activity: Not on file  ?Stress: Not on file  ?Social Connections: Not on file  ?Intimate Partner Violence: Not on file  ? ?History reviewed. No pertinent surgical history. ?History reviewed. No pertinent family history. ? ?Current Outpatient Medications:  ?  amLODipine (NORVASC) 10 MG tablet, TAKE 1 TABLET BY MOUTH EVERY DAY, Disp: 90 tablet, Rfl: 0 ?  chlorthalidone (HYGROTON) 25 MG tablet, Take 1 tablet (25 mg total) by mouth daily., Disp: 90  tablet, Rfl: 3 ?  omeprazole (PRILOSEC) 20 MG capsule, TAKE 1 CAPSULE BY MOUTH EVERY DAY, Disp: 90 capsule, Rfl: 0 ?  Potassium Chloride ER 20 MEQ TBCR, TAKE 1 TABLET BY MOUTH EVERY DAY, Disp: 90 tablet, Rfl: 0 ?  pravastatin (PRAVACHOL) 80 MG tablet, TAKE 1 TABLET BY MOUTH EVERY DAY, Disp: 90 tablet, Rfl: 0 ? ?Allergies  ?Allergen Reactions  ? Lisinopril Anaphylaxis  ?  angioedema  ?  ? ?ROS: ?Review of Systems ?A comprehensive review of systems was negative except for: Ears, nose, mouth, throat, and face: positive for poor dentition ?Gastrointestinal: positive for dyspepsia ?GU: nocturia   ? ?Physical exam ?BP (!) 155/72   Pulse 75   Temp 98.4 ?F (36.9 ?C)   Ht $R'5\' 8"'tt$  (1.727 m)   Wt 196 lb 6.4 oz (89.1 kg)   SpO2 95%   BMI 29.86 kg/m?  ?General appearance: alert, cooperative, appears older than stated age, and no distress ?Head: Normocephalic, without obvious abnormality, atraumatic ?Eyes: negative findings: lids and lashes normal, conjunctivae and sclerae normal, corneas clear, and pupils equal, round, reactive to light and accomodation ?Ears: normal TM's and external ear canals both ears ?Nose: Nares normal. Septum midline. Mucosa normal. No drainage or sinus tenderness. ?Throat:  Multiple missing teeth.  No oropharyngeal or sublingual masses.  Poor dentition. ?Neck: no adenopathy, supple, symmetrical, trachea midline, and thyroid  not enlarged, symmetric, no tenderness/mass/nodules ?Back: symmetric, no curvature. ROM normal. No CVA tenderness. ?Lungs:  Globally decreased breath sounds.  No wheezes, rhonchi or rales.  Normal work of breathing on room air ?Chest wall: no tenderness ?Heart: regular rate and rhythm, S1, S2 normal, no murmur, click, rub or gallop ?Abdomen:  Protuberant.  Slightly enlarged liver.  Nontender.  Diastases recti present ?Extremities:  Warm, atraumatic ?Pulses: 2+ and symmetric ?Skin:  No rashes, lesions except for skin tags on the eyes appreciated ?Lymph nodes: Cervical,  supraclavicular, and axillary nodes normal. ?Neurologic: Grossly normal ? ?  02/20/2022  ?  3:09 PM 01/14/2021  ?  2:41 PM 02/20/2020  ? 11:05 AM  ?Depression screen PHQ 2/9  ?Decreased Interest 0 2 2  ?Down, Depressed, Hopeless 0 1 1  ?PHQ - 2 Score 0 3 3  ?Altered sleeping  1 0  ?Tired, decreased energy  2 0  ?Change in appetite  0 0  ?Feeling bad or failure about yourself   0 0  ?Trouble concentrating  0 0  ?Moving slowly or fidgety/restless  0 0  ?Suicidal thoughts  0 0  ?PHQ-9 Score  6 3  ?Difficult doing work/chores  Not difficult at all   ? ? ? ?Assessment/ Plan: ?Daniel Baxter here for annual physical exam.  ? ?Annual physical exam ? ?Primary hypertension - Plan: CMP14+EGFR, amLODipine (NORVASC) 10 MG tablet, chlorthalidone (HYGROTON) 25 MG tablet, Potassium Chloride ER 20 MEQ TBCR ? ?Mixed hyperlipidemia - Plan: CMP14+EGFR, Lipid panel, TSH, pravastatin (PRAVACHOL) 80 MG tablet ? ?Vitamin D deficiency - Plan: VITAMIN D 25 Hydroxy (Vit-D Deficiency, Fractures) ? ?Gastroesophageal reflux disease without esophagitis - Plan: omeprazole (PRILOSEC) 20 MG capsule ? ?Alcohol use disorder, mild, abuse ? ?Tobacco use disorder - Plan: CBC ? ?Nocturia - Plan: PSA ? ?Blood pressure not at goal.  Suspect this is because he has not taken his chlorthalidone yet.  Recheck was much better than initial but still above goal.  Check renal function, liver enzymes and electrolytes.  Renewals of medications have been sent I have asked that he follow-up in 2 weeks for blood pressure recheck with nurse.  If persistently above 150/90 at that visit, we will plan for hydralazine as he has history of anaphylaxis to lisinopril so would not want to use ARB. ? ?Nonfasting lipid panel ordered.  Continue statin.  Check liver enzymes and TSH ? ?Check vitamin D level given history of vitamin D deficiency ? ?GERD is chronic and stable.  PPI renewed ? ?Alcohol use disorder.  Drinking 6 beers per day.  No intent to discontinue ? ?Precontemplative  from smoking cessation standpoint.  Offered low-dose CT scan to screen for lung cancer but he declines ? ?PSA collected.  Patient has nocturia ? ?Counseled on healthy lifestyle choices, including diet (rich in fruits, vegetables and lean meats and low in salt and simple carbohydrates) and exercise (at least 30 minutes of moderate physical activity daily). ? ?Daniel Donovan M. Towanna Avery, DO ? ? ? ? ? ?

## 2022-02-20 NOTE — Patient Instructions (Signed)
Tobacco Use Disorder ?Tobacco use disorder (TUD) occurs when a person craves, seeks, and uses tobacco, regardless of the consequences. This disorder can cause problems with mental and physical health. It can affect your ability to have healthy relationships, and it can keep you from meeting your responsibilities at work, home, or school. ?Tobacco may be: ?Smoked as a cigarette or cigar. ?Inhaled using e-cigarettes. ?Smoked in a pipe or hookah. ?Chewed as smokeless tobacco. ?Inhaled into the nostrils as snuff. ?Tobacco products contain a dangerous chemical called nicotine, which is very addictive. Nicotine triggers hormones that make the body feel stimulated and works on areas of the brain that make you feel good. These effects can make it hard for people to quit nicotine. ?Tobacco contains many other unsafe chemicals that can damage almost every organ in the body. Smoking tobacco also puts others in danger due to fire risk and possible health problems caused by breathing in secondhand smoke. ?What are the signs or symptoms? ?Symptoms of TUD may include: ?Being unable to slow down or stop your tobacco use. ?Spending an abnormal amount of time getting or using tobacco. ?Craving tobacco. Cravings may last for up to 6 months after quitting. ?Tobacco use that: ?Interferes with your work, school, or home life. ?Interferes with your personal and social relationships. ?Makes you give up activities that you once enjoyed or found important. ?Using tobacco even though you know that it is: ?Dangerous or bad for your health or someone else's health. ?Causing problems in your life. ?Needing more and more of the substance to get the same effect (developing tolerance). ?Experiencing unpleasant symptoms if you do not use the substance (withdrawal). Withdrawal symptoms may include: ?Depressed, anxious, or irritable mood. ?Difficulty concentrating. ?Increased appetite. ?Restlessness or trouble sleeping. ?Using the substance to avoid  withdrawal. ?How is this diagnosed? ?This condition may be diagnosed based on: ?Your current and past tobacco use. Your health care provider may ask questions about how your tobacco use affects your life. ?A physical exam. ?You may be diagnosed with TUD if you have at least two symptoms within a 12-month period. ?How is this treated? ?This condition is treated by stopping tobacco use. Many people are unable to quit on their own and need help. Treatment may include: ?Nicotine replacement therapy (NRT). NRT provides nicotine without the other harmful chemicals in tobacco. NRT gradually lowers the dosage of nicotine in the body and reduces withdrawal symptoms. NRT is available as: ?Over-the-counter gums, lozenges, and skin patches. ?Prescription mouth inhalers and nasal sprays. ?Medicine that acts on the brain to reduce cravings and withdrawal symptoms. ?A type of talk therapy that examines your triggers for tobacco use, how to avoid them, and how to cope with cravings (behavioral therapy). ?Hypnosis. This may help with withdrawal symptoms. ?Joining a support group for others coping with TUD. ?The best treatment for TUD is usually a combination of medicine, talk therapy, and support groups. Recovery can be a long process. Many people start using tobacco again after stopping (relapse). If you relapse, it does not mean that treatment will not work. ?Follow these instructions at home: ?Lifestyle ?Do not use any products that contain nicotine or tobacco, such as cigarettes and e-cigarettes. ?Avoid things that trigger tobacco use as much as you can. Triggers include people and situations that usually cause you to use tobacco. ?Avoid drinks that contain caffeine, including coffee. These may worsen some withdrawal symptoms. ?Find ways to manage stress. Wanting to smoke may cause stress, and stress can make you want to   smoke. Relaxation techniques such as deep breathing, meditation, and yoga may help. ?Attend support groups as  needed. These groups are an important part of long-term recovery for many people. ?General instructions ?Take over-the-counter and prescription medicines only as told by your health care provider. ?Check with your health care provider before taking any new prescription or over-the-counter medicines. ?Decide on a friend, family member, or smoking quit-line (such as 1-800-QUIT-NOW in the [place] that you can call or text when you feel the urge to smoke or when you need help coping with cravings. ?Keep all follow-up visits as told by your health care provider and therapist. This is important. ?Contact a health care provider if: ?You are not able to take your medicines as prescribed. ?Your symptoms get worse, even with treatment. ?Summary ?Tobacco use disorder (TUD) occurs when a person craves, seeks, and uses tobacco regardless of the consequences. ?This condition may be diagnosed based on your current and past tobacco use and a physical exam. ?Many people are unable to quit on their own and need help. Recovery can be a long process. ?The most effective treatment for TUD is usually a combination of medicine, talk therapy, and support groups. ?This information is not intended to replace advice given to you by your health care provider. Make sure you discuss any questions you have with your health care provider. ?Document Revised: 07/22/2021 Document Reviewed: 10/05/2020 ?Elsevier Patient Education ? 2022 Elsevier Inc[employer] ? ?

## 2022-02-21 ENCOUNTER — Telehealth: Payer: Self-pay | Admitting: Family Medicine

## 2022-02-21 LAB — CBC
Hematocrit: 43.3 % (ref 37.5–51.0)
Hemoglobin: 14.7 g/dL (ref 13.0–17.7)
MCH: 31.5 pg (ref 26.6–33.0)
MCHC: 33.9 g/dL (ref 31.5–35.7)
MCV: 93 fL (ref 79–97)
Platelets: 286 10*3/uL (ref 150–450)
RBC: 4.67 x10E6/uL (ref 4.14–5.80)
RDW: 14 % (ref 11.6–15.4)
WBC: 9.1 10*3/uL (ref 3.4–10.8)

## 2022-02-21 LAB — CMP14+EGFR
ALT: 39 IU/L (ref 0–44)
AST: 34 IU/L (ref 0–40)
Albumin/Globulin Ratio: 1.2 (ref 1.2–2.2)
Albumin: 4.3 g/dL (ref 3.8–4.8)
Alkaline Phosphatase: 125 IU/L — ABNORMAL HIGH (ref 44–121)
BUN/Creatinine Ratio: 10 (ref 10–24)
BUN: 11 mg/dL (ref 8–27)
Bilirubin Total: 0.3 mg/dL (ref 0.0–1.2)
CO2: 27 mmol/L (ref 20–29)
Calcium: 10 mg/dL (ref 8.6–10.2)
Chloride: 94 mmol/L — ABNORMAL LOW (ref 96–106)
Creatinine, Ser: 1.09 mg/dL (ref 0.76–1.27)
Globulin, Total: 3.7 g/dL (ref 1.5–4.5)
Glucose: 100 mg/dL — ABNORMAL HIGH (ref 70–99)
Potassium: 3.4 mmol/L — ABNORMAL LOW (ref 3.5–5.2)
Sodium: 137 mmol/L (ref 134–144)
Total Protein: 8 g/dL (ref 6.0–8.5)
eGFR: 74 mL/min/{1.73_m2} (ref >59)

## 2022-02-21 LAB — LIPID PANEL
Chol/HDL Ratio: 3.2 ratio (ref 0.0–5.0)
Cholesterol, Total: 145 mg/dL (ref 100–199)
HDL: 45 mg/dL (ref >39)
LDL Chol Calc (NIH): 77 mg/dL (ref 0–99)
Triglycerides: 129 mg/dL (ref 0–149)
VLDL Cholesterol Cal: 23 mg/dL (ref 5–40)

## 2022-02-21 LAB — TSH: TSH: 4.78 u[IU]/mL — ABNORMAL HIGH (ref 0.450–4.500)

## 2022-02-21 LAB — PSA: Prostate Specific Ag, Serum: 0.8 ng/mL (ref 0.0–4.0)

## 2022-02-21 LAB — VITAMIN D 25 HYDROXY (VIT D DEFICIENCY, FRACTURES): Vit D, 25-Hydroxy: 28.7 ng/mL — ABNORMAL LOW (ref 30.0–100.0)

## 2022-02-21 NOTE — Telephone Encounter (Signed)
Returned call, lmtcb. 

## 2022-03-06 ENCOUNTER — Ambulatory Visit (INDEPENDENT_AMBULATORY_CARE_PROVIDER_SITE_OTHER): Payer: Medicare HMO | Admitting: Family Medicine

## 2022-03-06 DIAGNOSIS — I10 Essential (primary) hypertension: Secondary | ICD-10-CM

## 2022-03-06 NOTE — Progress Notes (Signed)
Patient come in today for 2 week BP check and it was 169/67 and heart Rate 78. Patient denies any HA and dizzy vision changes states he feels fine. Checked it again the second time and it was 172/67 Heart 78. Please advise  ?

## 2022-03-09 ENCOUNTER — Other Ambulatory Visit: Payer: Self-pay | Admitting: Family Medicine

## 2022-03-09 DIAGNOSIS — I10 Essential (primary) hypertension: Secondary | ICD-10-CM

## 2022-03-09 MED ORDER — HYDRALAZINE HCL 10 MG PO TABS: 10.0000 mg | ORAL_TABLET | Freq: Three times a day (TID) | ORAL | 1 refills | Status: DC

## 2022-03-09 NOTE — Progress Notes (Signed)
Blood pressure is not at goal.  Add hydralazine 10 mg 3 times daily.  Rx sent to pharmacy.  Would like him to have a blood pressure recheck in 1 month with nurse.  Goal blood pressure less than 140/90.  Continue current regimen as otherwise prescribed ?

## 2022-03-10 ENCOUNTER — Telehealth: Payer: Self-pay

## 2022-03-10 NOTE — Telephone Encounter (Signed)
Pt aware, verbalized understanding, scheduled ?

## 2022-03-10 NOTE — Progress Notes (Signed)
Scheduled, pt aware verbalized understanding ?

## 2022-03-10 NOTE — Telephone Encounter (Signed)
lmtcb

## 2022-03-10 NOTE — Telephone Encounter (Signed)
-----   Message from Janora Norlander, DO sent at 03/09/2022  6:42 PM EDT ----- ? ? ? ?----- Message ----- ?From: Ladean Raya, CMA ?Sent: 03/06/2022  10:37 AM EDT ?To: Janora Norlander, DO ? ?

## 2022-03-10 NOTE — Progress Notes (Signed)
lmtcb

## 2022-04-03 ENCOUNTER — Other Ambulatory Visit: Payer: Self-pay | Admitting: Family Medicine

## 2022-04-03 DIAGNOSIS — I10 Essential (primary) hypertension: Secondary | ICD-10-CM

## 2022-04-10 ENCOUNTER — Ambulatory Visit (INDEPENDENT_AMBULATORY_CARE_PROVIDER_SITE_OTHER): Payer: Medicare HMO | Admitting: Emergency Medicine

## 2022-04-10 ENCOUNTER — Other Ambulatory Visit: Payer: Self-pay | Admitting: Family Medicine

## 2022-04-10 DIAGNOSIS — I10 Essential (primary) hypertension: Secondary | ICD-10-CM

## 2022-04-10 MED ORDER — HYDRALAZINE HCL 25 MG PO TABS: 25.0000 mg | ORAL_TABLET | Freq: Three times a day (TID) | ORAL | 3 refills | Status: DC

## 2022-04-10 NOTE — Progress Notes (Signed)
L/m to RC

## 2022-04-10 NOTE — Progress Notes (Signed)
165/63  HR 103 ? ?166/64  HR 102 ? ?Patient states that his Blood Pressure has been running high 150-165's at home. He states that he is compliant with his medications.  ? ?Please advise  ?

## 2022-04-10 NOTE — Progress Notes (Signed)
I have increased his Hydralazine to 25mg  TID.  Please have him start this new regimen ASAP and return in 2-4 weeks for recheck BP. ?

## 2022-04-11 ENCOUNTER — Telehealth: Payer: Self-pay | Admitting: Family Medicine

## 2022-04-11 NOTE — Telephone Encounter (Signed)
Started new BP medications April. Reviewed medication list with patient and he states that he takes is medications everyday. ? ?BP is 99991111 systolic. He could not remember Dystolic numberss. ? ?Pt denies, HA, chest pain, change in vision, sob. He does have swelling in lower extremities if he stands a lot during the day. The swelling goes down during the night. ? ?Informed pt that Dr. Lajuana Ripple sent a new prescription for hydralazine 25mg  TID. Advised pt to start today and continue with all other medications except the 10mg  Hydralazine. ? ?Nurse visit scheduled 6/6 at 2pm. Pt requested pm appt. ? ?

## 2022-04-17 ENCOUNTER — Telehealth: Payer: Self-pay | Admitting: Family Medicine

## 2022-04-17 NOTE — Telephone Encounter (Signed)
  Left message for patient to call back and schedule Medicare Annual Wellness Visit (AWV) to be completed by video or phone.  No hx of AWV eligible for AWVI per [facility] as of 04/28/2019  Please schedule at anytime with [facility] Nurse Health Advisor --- Germaine Pomfret  45 Minutes appointment   Any questions, please call me at [phone]

## 2022-05-02 ENCOUNTER — Ambulatory Visit: Payer: Medicare HMO

## 2022-07-07 ENCOUNTER — Other Ambulatory Visit: Payer: Self-pay | Admitting: Family Medicine

## 2022-07-07 DIAGNOSIS — I10 Essential (primary) hypertension: Secondary | ICD-10-CM

## 2022-07-19 ENCOUNTER — Encounter: Payer: Self-pay | Admitting: *Deleted

## 2023-01-01 ENCOUNTER — Other Ambulatory Visit: Payer: Self-pay | Admitting: Family Medicine

## 2023-01-01 DIAGNOSIS — I10 Essential (primary) hypertension: Secondary | ICD-10-CM

## 2023-01-23 ENCOUNTER — Other Ambulatory Visit: Payer: Self-pay | Admitting: Family Medicine

## 2023-01-23 DIAGNOSIS — I10 Essential (primary) hypertension: Secondary | ICD-10-CM

## 2023-01-27 ENCOUNTER — Other Ambulatory Visit: Payer: Self-pay | Admitting: Family Medicine

## 2023-01-27 DIAGNOSIS — I10 Essential (primary) hypertension: Secondary | ICD-10-CM

## 2023-02-20 ENCOUNTER — Other Ambulatory Visit: Payer: Self-pay | Admitting: Family Medicine

## 2023-02-20 DIAGNOSIS — I10 Essential (primary) hypertension: Secondary | ICD-10-CM

## 2023-02-20 NOTE — Telephone Encounter (Signed)
Please see PCP's note, NTBS for med RF before CPE, can keep CPE appt in Aug

## 2023-02-20 NOTE — Telephone Encounter (Signed)
He has NOT been seen in >1 year.  He cancelled his appt.  Not sure why.  NTBS for more refills.  August is the soonest PE but he can come in for med refill prior to that time.

## 2023-02-21 ENCOUNTER — Encounter: Payer: Medicare HMO | Admitting: Family Medicine

## 2023-02-21 ENCOUNTER — Encounter: Payer: Self-pay | Admitting: Family Medicine

## 2023-02-21 NOTE — Telephone Encounter (Signed)
LMTCB. Letter mailed ?

## 2023-02-22 ENCOUNTER — Encounter: Payer: Medicare HMO | Admitting: Family Medicine

## 2023-03-16 ENCOUNTER — Other Ambulatory Visit: Payer: Self-pay | Admitting: Family Medicine

## 2023-03-16 DIAGNOSIS — I10 Essential (primary) hypertension: Secondary | ICD-10-CM

## 2023-03-28 ENCOUNTER — Other Ambulatory Visit: Payer: Self-pay | Admitting: Family Medicine

## 2023-03-28 DIAGNOSIS — I10 Essential (primary) hypertension: Secondary | ICD-10-CM

## 2023-03-29 ENCOUNTER — Other Ambulatory Visit: Payer: Self-pay | Admitting: Family Medicine

## 2023-03-29 DIAGNOSIS — E782 Mixed hyperlipidemia: Secondary | ICD-10-CM

## 2023-03-30 ENCOUNTER — Other Ambulatory Visit: Payer: Self-pay | Admitting: Family Medicine

## 2023-03-30 DIAGNOSIS — I10 Essential (primary) hypertension: Secondary | ICD-10-CM

## 2023-03-30 DIAGNOSIS — K219 Gastro-esophageal reflux disease without esophagitis: Secondary | ICD-10-CM

## 2023-04-16 ENCOUNTER — Ambulatory Visit (INDEPENDENT_AMBULATORY_CARE_PROVIDER_SITE_OTHER): Payer: Medicare HMO | Admitting: Family Medicine

## 2023-04-16 ENCOUNTER — Encounter: Payer: Self-pay | Admitting: Family Medicine

## 2023-04-16 VITALS — BP 151/60 | HR 80 | Temp 98.8°F | Ht 68.0 in | Wt 192.0 lb

## 2023-04-16 DIAGNOSIS — E782 Mixed hyperlipidemia: Secondary | ICD-10-CM

## 2023-04-16 DIAGNOSIS — I10 Essential (primary) hypertension: Secondary | ICD-10-CM | POA: Diagnosis not present

## 2023-04-16 DIAGNOSIS — K219 Gastro-esophageal reflux disease without esophagitis: Secondary | ICD-10-CM

## 2023-04-16 MED ORDER — POTASSIUM CHLORIDE ER 20 MEQ PO TBCR
1.0000 | EXTENDED_RELEASE_TABLET | Freq: Every day | ORAL | 3 refills | Status: DC
Start: 2023-04-16 — End: 2023-07-23

## 2023-04-16 MED ORDER — PRAVASTATIN SODIUM 80 MG PO TABS: 80.0000 mg | ORAL_TABLET | Freq: Every day | ORAL | 3 refills | Status: DC

## 2023-04-16 MED ORDER — CHLORTHALIDONE 25 MG PO TABS: 25.0000 mg | ORAL_TABLET | Freq: Every day | ORAL | 3 refills | Status: DC

## 2023-04-16 MED ORDER — HYDRALAZINE HCL 25 MG PO TABS
25.0000 mg | ORAL_TABLET | Freq: Three times a day (TID) | ORAL | 3 refills | Status: DC
Start: 2023-04-16 — End: 2023-07-23

## 2023-04-16 MED ORDER — AMLODIPINE BESYLATE 10 MG PO TABS: 10.0000 mg | ORAL_TABLET | Freq: Every day | ORAL | 3 refills | Status: DC

## 2023-04-16 MED ORDER — OMEPRAZOLE 20 MG PO CPDR: 20.0000 mg | DELAYED_RELEASE_CAPSULE | Freq: Every day | ORAL | 3 refills | Status: DC

## 2023-04-16 NOTE — Progress Notes (Signed)
Subjective: CC: Hypertension with hyperlipidemia PCP: Raliegh Ip, DO [id number] Daniel Baxter is a 72 y.o. male presenting to clinic today for:  1.  Hypertension with hyperlipidemia Patient is compliant with medications but admits he has not taken chlorthalidone.  He ate a very salt heavy meal yesterday.  On average his blood pressures run systolics of 145.  He denies any chest pain, shortness of breath, edema.  Continues to smoke.   ROS: Per HPI  Allergies  Allergen Reactions   Lisinopril Anaphylaxis    angioedema   Past Medical History:  Diagnosis Date   Hyperlipidemia    Hypertension     Current Outpatient Medications:    amLODipine (NORVASC) 10 MG tablet, Take 1 tablet (10 mg total) by mouth daily., Disp: 90 tablet, Rfl: 3   chlorthalidone (HYGROTON) 25 MG tablet, Take 1 tablet (25 mg total) by mouth daily., Disp: 90 tablet, Rfl: 3   hydrALAZINE (APRESOLINE) 25 MG tablet, Take 1 tablet (25 mg total) by mouth every 8 (eight) hours., Disp: 270 tablet, Rfl: 3   omeprazole (PRILOSEC) 20 MG capsule, Take 1 capsule (20 mg total) by mouth daily., Disp: 90 capsule, Rfl: 3   Potassium Chloride ER 20 MEQ TBCR, Take 1 tablet (20 mEq total) by mouth daily., Disp: 90 tablet, Rfl: 3   pravastatin (PRAVACHOL) 80 MG tablet, Take 1 tablet (80 mg total) by mouth daily., Disp: 90 tablet, Rfl: 3 Social History   Socioeconomic History   Marital status: Married    Spouse name: Not on file   Number of children: Not on file   Years of education: Not on file   Highest education level: Not on file  Occupational History   Not on file  Tobacco Use   Smoking status: Every Day    Packs/day: .25    Types: Cigarettes   Smokeless tobacco: Never  Vaping Use   Vaping Use: Never used  Substance and Sexual Activity   Alcohol use: No   Drug use: No   Sexual activity: Not Currently  Other Topics Concern   Not on file  Social History Narrative   Not on file   Social Determinants of Health    Financial Resource Strain: Not on file  Food Insecurity: Not on file  Transportation Needs: Not on file  Physical Activity: Not on file  Stress: Not on file  Social Connections: Not on file  Intimate Partner Violence: Not on file   History reviewed. No pertinent family history.  Objective: Office vital signs reviewed. BP (!) 162/64   Pulse 80   Temp 98.8 F (37.1 C)   Ht 5\' 8"  (1.727 m)   Wt 192 lb (87.1 kg)   SpO2 93%   BMI 29.19 kg/m   Physical Examination:  General: Awake, alert, No acute distress HEENT: Sclera white.  Smells strongly of tobacco. Cardio: regular rate and rhythm, S1S2 heard, no murmurs appreciated Pulm: clear to auscultation bilaterally, no wheezes, rhonchi or rales; normal work of breathing on room air   Assessment/ Plan: 72 y.o. male   Primary hypertension - Plan: amLODipine (NORVASC) 10 MG tablet, chlorthalidone (HYGROTON) 25 MG tablet, hydrALAZINE (APRESOLINE) 25 MG tablet, Potassium Chloride ER 20 MEQ TBCR  Mixed hyperlipidemia - Plan: pravastatin (PRAVACHOL) 80 MG tablet  Gastroesophageal reflux disease without esophagitis - Plan: omeprazole (PRILOSEC) 20 MG capsule  Blood pressure not at goal but patient has not taken down, which I renewed today.  He has an appoint with me scheduled in 3 months.  We will recheck blood pressure and if remains uncontrolled plan to advance hydralazine to 50 mg 3 times daily.  Will plan for fasting lipid, labs at next visit.  He is aware of date and time and need for fasting  No orders of the defined types were placed in this encounter.  Meds ordered this encounter  Medications   amLODipine (NORVASC) 10 MG tablet    Sig: Take 1 tablet (10 mg total) by mouth daily.    Dispense:  90 tablet    Refill:  3   chlorthalidone (HYGROTON) 25 MG tablet    Sig: Take 1 tablet (25 mg total) by mouth daily.    Dispense:  90 tablet    Refill:  3   hydrALAZINE (APRESOLINE) 25 MG tablet    Sig: Take 1 tablet (25 mg total)  by mouth every 8 (eight) hours.    Dispense:  270 tablet    Refill:  3   omeprazole (PRILOSEC) 20 MG capsule    Sig: Take 1 capsule (20 mg total) by mouth daily.    Dispense:  90 capsule    Refill:  3   Potassium Chloride ER 20 MEQ TBCR    Sig: Take 1 tablet (20 mEq total) by mouth daily.    Dispense:  90 tablet    Refill:  3   pravastatin (PRAVACHOL) 80 MG tablet    Sig: Take 1 tablet (80 mg total) by mouth daily.    Dispense:  90 tablet    Refill:  3     Daniel Mcqueary Hulen Skains, DO [facility] [phone]

## 2023-06-29 ENCOUNTER — Ambulatory Visit: Payer: Medicare HMO

## 2023-06-29 VITALS — Ht 68.0 in | Wt 195.0 lb

## 2023-06-29 DIAGNOSIS — Z0100 Encounter for examination of eyes and vision without abnormal findings: Secondary | ICD-10-CM

## 2023-06-29 DIAGNOSIS — Z0000 Encounter for general adult medical examination without abnormal findings: Secondary | ICD-10-CM | POA: Diagnosis not present

## 2023-06-29 NOTE — Patient Instructions (Signed)
Daniel Baxter , Thank you for taking time to come for your Medicare Wellness Visit. I appreciate your ongoing commitment to your health goals. Please review the following plan we discussed and let me know if I can assist you in the future.   Referrals/Orders/Follow-Ups/Clinician Recommendations: Aim for 30 minutes of exercise or brisk walking, 6-8 glasses of water, and 5 servings of fruits and vegetables each day.   This is a list of the screening recommended for you and due dates:  Health Maintenance  Topic Date Due   COVID-19 Vaccine (1 - 2023-24 season) Never done   Flu Shot  06/28/2023   Zoster (Shingles) Vaccine (1 of 2) 07/17/2023*   Pneumonia Vaccine (1 of 2 - PCV) 04/15/2024*   DTaP/Tdap/Td vaccine (2 - Td or Tdap) 05/07/2024   Colon Cancer Screening  05/07/2024   Medicare Annual Wellness Visit  06/28/2024   Hepatitis C Screening  Completed   HPV Vaccine  Aged Out  *Topic was postponed. The date shown is not the original due date.    Advanced directives: (ACP Link)Information on Advanced Care Planning can be found at [facility] of [facility] Directives Advance Health Care Directives (http://guzman.[email or web address]/) Information on Advanced Care Planning can be found at [facility] of [facility] Advance Health Care Directives Advance Health Care Directives (http://guzman.[email or web address]/)    If you wish to quit smoking, help is available. For free tobacco cessation program offerings call the [facility] at 201-238-4721 or Live Well Line at (320)164-9802. You may also visit [email or web address] or email livelifewell@[email or web address] .com for more information on other programs.   You may also call 1-800-QUIT-NOW (3218808804) or visit www.[place].ch or www.BecomeAnEx.org for additional resources on smoking cessation.    Next Medicare Annual Wellness Visit scheduled for next year: Yes  Preventive Care 60 Years and Older, Male  Preventive care refers to lifestyle choices and  visits with your health care provider that can promote health and wellness. What does preventive care include? A yearly physical exam. This is also called an annual well check. Dental exams once or twice a year. Routine eye exams. Ask your health care provider how often you should have your eyes checked. Personal lifestyle choices, including: Daily care of your teeth and gums. Regular physical activity. Eating a healthy diet. Avoiding tobacco and drug use. Limiting alcohol use. Practicing safe sex. Taking low doses of aspirin every day. Taking vitamin and mineral supplements as recommended by your health care provider. What happens during an annual well check? The services and screenings done by your health care provider during your annual well check will depend on your age, overall health, lifestyle risk factors, and family history of disease. Counseling  Your health care provider may ask you questions about your: Alcohol use. Tobacco use. Drug use. Emotional well-being. Home and relationship well-being. Sexual activity. Eating habits. History of falls. Memory and ability to understand (cognition). Work and work Astronomer. Screening  You may have the following tests or measurements: Height, weight, and BMI. Blood pressure. Lipid and cholesterol levels. These may be checked every 5 years, or more frequently if you are over 32 years old. Skin check. Lung cancer screening. You may have this screening every year starting at age 67 if you have a 30-pack-year history of smoking and currently smoke or have quit within the past 15 years. Fecal occult blood test (FOBT) of the stool. You may have this test every year starting at age 76. Flexible sigmoidoscopy or colonoscopy.  You may have a sigmoidoscopy every 5 years or a colonoscopy every 10 years starting at age 46. Prostate cancer screening. Recommendations will vary depending on your family history and other risks. Hepatitis C  blood test. Hepatitis B blood test. Sexually transmitted disease (STD) testing. Diabetes screening. This is done by checking your blood sugar (glucose) after you have not eaten for a while (fasting). You may have this done every 1-3 years. Abdominal aortic aneurysm (AAA) screening. You may need this if you are a current or former smoker. Osteoporosis. You may be screened starting at age 55 if you are at high risk. Talk with your health care provider about your test results, treatment options, and if necessary, the need for more tests. Vaccines  Your health care provider may recommend certain vaccines, such as: Influenza vaccine. This is recommended every year. Tetanus, diphtheria, and acellular pertussis (Tdap, Td) vaccine. You may need a Td booster every 10 years. Zoster vaccine. You may need this after age 42. Pneumococcal 13-valent conjugate (PCV13) vaccine. One dose is recommended after age 42. Pneumococcal polysaccharide (PPSV23) vaccine. One dose is recommended after age 1. Talk to your health care provider about which screenings and vaccines you need and how often you need them. This information is not intended to replace advice given to you by your health care provider. Make sure you discuss any questions you have with your health care provider. Document Released: 12/10/2015 Document Revised: 08/02/2016 Document Reviewed: 09/14/2015 Elsevier Interactive Patient Education  2017 [employer].  Fall Prevention in the Home Falls can cause injuries. They can happen to people of all ages. There are many things you can do to make your home safe and to help prevent falls. What can I do on the outside of my home? Regularly fix the edges of walkways and driveways and fix any cracks. Remove anything that might make you trip as you walk through a door, such as a raised step or threshold. Trim any bushes or trees on the path to your home. Use bright outdoor lighting. Clear any walking paths of  anything that might make someone trip, such as rocks or tools. Regularly check to see if handrails are loose or broken. Make sure that both sides of any steps have handrails. Any raised decks and porches should have guardrails on the edges. Have any leaves, snow, or ice cleared regularly. Use sand or salt on walking paths during winter. Clean up any spills in your garage right away. This includes oil or grease spills. What can I do in the bathroom? Use night lights. Install grab bars by the toilet and in the tub and shower. Do not use towel bars as grab bars. Use non-skid mats or decals in the tub or shower. If you need to sit down in the shower, use a plastic, non-slip stool. Keep the floor dry. Clean up any water that spills on the floor as soon as it happens. Remove soap buildup in the tub or shower regularly. Attach bath mats securely with double-sided non-slip rug tape. Do not have throw rugs and other things on the floor that can make you trip. What can I do in the bedroom? Use night lights. Make sure that you have a light by your bed that is easy to reach. Do not use any sheets or blankets that are too big for your bed. They should not hang down onto the floor. Have a firm chair that has side arms. You can use this for support while you  get dressed. Do not have throw rugs and other things on the floor that can make you trip. What can I do in the kitchen? Clean up any spills right away. Avoid walking on wet floors. Keep items that you use a lot in easy-to-reach places. If you need to reach something above you, use a strong step stool that has a grab bar. Keep electrical cords out of the way. Do not use floor polish or wax that makes floors slippery. If you must use wax, use non-skid floor wax. Do not have throw rugs and other things on the floor that can make you trip. What can I do with my stairs? Do not leave any items on the stairs. Make sure that there are handrails on both  sides of the stairs and use them. Fix handrails that are broken or loose. Make sure that handrails are as long as the stairways. Check any carpeting to make sure that it is firmly attached to the stairs. Fix any carpet that is loose or worn. Avoid having throw rugs at the top or bottom of the stairs. If you do have throw rugs, attach them to the floor with carpet tape. Make sure that you have a light switch at the top of the stairs and the bottom of the stairs. If you do not have them, ask someone to add them for you. What else can I do to help prevent falls? Wear shoes that: Do not have high heels. Have rubber bottoms. Are comfortable and fit you well. Are closed at the toe. Do not wear sandals. If you use a stepladder: Make sure that it is fully opened. Do not climb a closed stepladder. Make sure that both sides of the stepladder are locked into place. Ask someone to hold it for you, if possible. Clearly mark and make sure that you can see: Any grab bars or handrails. First and last steps. Where the edge of each step is. Use tools that help you move around (mobility aids) if they are needed. These include: Canes. Walkers. Scooters. Crutches. Turn on the lights when you go into a dark area. Replace any light bulbs as soon as they burn out. Set up your furniture so you have a clear path. Avoid moving your furniture around. If any of your floors are uneven, fix them. If there are any pets around you, be aware of where they are. Review your medicines with your doctor. Some medicines can make you feel dizzy. This can increase your chance of falling. Ask your doctor what other things that you can do to help prevent falls. This information is not intended to replace advice given to you by your health care provider. Make sure you discuss any questions you have with your health care provider. Document Released: 09/09/2009 Document Revised: 04/20/2016 Document Reviewed: 12/18/2014 Elsevier  Interactive Patient Education  2017 [employer].

## 2023-06-29 NOTE — Progress Notes (Signed)
Subjective:   Daniel Baxter is a 72 y.o. male who presents for an Initial Medicare Annual Wellness Visit.  Visit Complete: Virtual  I connected with  [name] on 06/29/23 by a audio enabled telemedicine application and verified that I am speaking with the correct person using two identifiers.  Patient Location: Home  Provider Location: Home Office  I discussed the limitations of evaluation and management by telemedicine. The patient expressed understanding and agreed to proceed.  Patient Medicare AWV questionnaire was completed by the patient on 06/29/2023; I have confirmed that all information answered by patient is correct and no changes since this date.  Review of Systems    Vital Signs: Unable to obtain new vitals due to this being a telehealth visit.  Cardiac Risk Factors include: advanced age (>16men, >38 women);hypertension;male gender;dyslipidemia     Objective:    Today's Vitals   06/29/23 1431  Weight: 195 lb (88.5 kg)  Height: 5\' 8"  (1.727 m)   Body mass index is 29.65 kg/m.     06/29/2023    2:36 PM  Advanced Directives  Does Patient Have a Medical Advance Directive? No  Would patient like information on creating a medical advance directive? Yes (MAU/Ambulatory/Procedural Areas - Information given)    Current Medications (verified) Outpatient Encounter Medications as of 06/29/2023  Medication Sig   amLODipine (NORVASC) 10 MG tablet Take 1 tablet (10 mg total) by mouth daily.   chlorthalidone (HYGROTON) 25 MG tablet Take 1 tablet (25 mg total) by mouth daily.   hydrALAZINE (APRESOLINE) 25 MG tablet Take 1 tablet (25 mg total) by mouth every 8 (eight) hours.   omeprazole (PRILOSEC) 20 MG capsule Take 1 capsule (20 mg total) by mouth daily.   Potassium Chloride ER 20 MEQ TBCR Take 1 tablet (20 mEq total) by mouth daily.   pravastatin (PRAVACHOL) 80 MG tablet Take 1 tablet (80 mg total) by mouth daily.   No facility-administered encounter medications on  file as of 06/29/2023.    Allergies (verified) Lisinopril   History: Past Medical History:  Diagnosis Date   Hyperlipidemia    Hypertension    History reviewed. No pertinent surgical history. History reviewed. No pertinent family history. Social History   Socioeconomic History   Marital status: Married    Spouse name: Not on file   Number of children: Not on file   Years of education: Not on file   Highest education level: Not on file  Occupational History   Not on file  Tobacco Use   Smoking status: Every Day    Current packs/day: 0.25    Types: Cigarettes   Smokeless tobacco: Never  Vaping Use   Vaping status: Never Used  Substance and Sexual Activity   Alcohol use: No   Drug use: No   Sexual activity: Not Currently  Other Topics Concern   Not on file  Social History Narrative   Not on file   Social Determinants of Health   Financial Resource Strain: Low Risk  (06/29/2023)   Overall Financial Resource Strain (CARDIA)    Difficulty of Paying Living Expenses: Not hard at all  Food Insecurity: No Food Insecurity (06/29/2023)   Hunger Vital Sign    Worried About Running Out of Food in the Last Year: Never true    Ran Out of Food in the Last Year: Never true  Transportation Needs: No Transportation Needs (06/29/2023)   PRAPARE - Transportation    Lack of Transportation (Medical): No    Lack  of Transportation (Non-Medical): No  Physical Activity: Inactive (06/29/2023)   Exercise Vital Sign    Days of Exercise per Week: 0 days    Minutes of Exercise per Session: 0 min  Stress: No Stress Concern Present (06/29/2023)   [employer] of Occupational Health - Occupational Stress Questionnaire    Feeling of Stress : Not at all  Social Connections: Moderately Isolated (06/29/2023)   Social Connection and Isolation Panel [NHANES]    Frequency of Communication with Friends and Family: More than three times a week    Frequency of Social Gatherings with Friends and Family: More  than three times a week    Attends Religious Services: Never    Database administrator or Organizations: No    Attends Engineer, structural: Never    Marital Status: Married    Tobacco Counseling Ready to quit: No Counseling given: Not Answered   Clinical Intake:  Pre-visit preparation completed: Yes  Pain : No/denies pain     Nutritional Risks: None Diabetes: No  How often do you need to have someone help you when you read instructions, pamphlets, or other written materials from your doctor or pharmacy?: 1 - Never  Interpreter Needed?: No  Information entered by :: Renie Ora, LPN   Activities of Daily Living    06/29/2023    2:36 PM  In your present state of health, do you have any difficulty performing the following activities:  Hearing? 0  Vision? 0  Difficulty concentrating or making decisions? 0  Walking or climbing stairs? 0  Dressing or bathing? 0  Doing errands, shopping? 0  Preparing Food and eating ? N  Using the Toilet? N  In the past six months, have you accidently leaked urine? N  Do you have problems with loss of bowel control? N  Managing your Medications? N  Managing your Finances? N  Housekeeping or managing your Housekeeping? N    Patient Care Team: Raliegh Ip, DO as PCP - General (Family Medicine)  Indicate any recent Medical Services you may have received from other than [facility] providers in the past year (date may be approximate).     Assessment:   This is a routine wellness examination for Daniel Baxter.  Hearing/Vision screen Vision Screening - Comments:: Referral 06/29/2023  Dietary issues and exercise activities discussed:     Goals Addressed             This Visit's Progress    Exercise 3x per week (30 min per time)         Depression Screen    06/29/2023    2:34 PM 04/16/2023    3:52 PM 02/20/2022    3:09 PM 01/14/2021    2:41 PM 02/20/2020   11:05 AM 07/08/2019   10:10 AM 06/20/2018    8:14 AM  PHQ 2/9  Scores  PHQ - 2 Score 0 0 0 3 3 0 0  PHQ- 9 Score  0  6 3 0     Fall Risk    06/29/2023    2:33 PM 04/16/2023    3:52 PM 02/20/2022    3:19 PM 02/20/2022    3:09 PM 06/20/2018    8:14 AM  Fall Risk   Falls in the past year? 0 0 0 0 No  Number falls in past yr: 0 0     Injury with Fall? 0 0     Risk for fall due to : No Fall Risks No Fall Risks  Follow up Falls prevention discussed Education provided       MEDICARE RISK AT HOME:  Medicare Risk at Home - 06/29/23 1433     Any stairs in or around the home? No    If so, are there any without handrails? No    Home free of loose throw rugs in walkways, pet beds, electrical cords, etc? Yes    Adequate lighting in your home to reduce risk of falls? Yes    Life alert? No    Use of a cane, walker or w/c? No    Grab bars in the bathroom? No    Shower chair or bench in shower? No    Elevated toilet seat or a handicapped toilet? No             TIMED UP AND GO:  Was the test performed? No    Cognitive Function:        06/29/2023    2:36 PM  6CIT Screen  What Year? 0 points  What month? 0 points  What time? 0 points  Count back from 20 0 points  Months in reverse 0 points  Repeat phrase 0 points  Total Score 0 points    Immunizations Immunization History  Administered Date(s) Administered   Influenza-Unspecified 09/08/2010   Tdap 05/07/2014    TDAP status: Up to date  Flu Vaccine status: Declined, Education has been provided regarding the importance of this vaccine but patient still declined. Advised may receive this vaccine at local pharmacy or Health Dept. Aware to provide a copy of the vaccination record if obtained from local pharmacy or Health Dept. Verbalized acceptance and understanding.  Pneumococcal vaccine status: Declined,  Education has been provided regarding the importance of this vaccine but patient still declined. Advised may receive this vaccine at local pharmacy or Health Dept. Aware to provide a  copy of the vaccination record if obtained from local pharmacy or Health Dept. Verbalized acceptance and understanding.   Covid-19 vaccine status: Completed vaccines  Qualifies for Shingles Vaccine? Yes   Zostavax completed No   Shingrix Completed?: No.    Education has been provided regarding the importance of this vaccine. Patient has been advised to call insurance company to determine out of pocket expense if they have not yet received this vaccine. Advised may also receive vaccine at local pharmacy or Health Dept. Verbalized acceptance and understanding.  Screening Tests Health Maintenance  Topic Date Due   COVID-19 Vaccine (1 - 2023-24 season) Never done   INFLUENZA VACCINE  06/28/2023   Zoster Vaccines- Shingrix (1 of 2) 07/17/2023 (Originally 05/11/2003)   Pneumonia Vaccine 45+ Years old (1 of 2 - PCV) 04/15/2024 (Originally 05/11/1957)   DTaP/Tdap/Td (2 - Td or Tdap) 05/07/2024   Colonoscopy  05/07/2024   Medicare Annual Wellness (AWV)  06/28/2024   Hepatitis C Screening  Completed   HPV VACCINES  Aged Out    Health Maintenance  Health Maintenance Due  Topic Date Due   COVID-19 Vaccine (1 - 2023-24 season) Never done   INFLUENZA VACCINE  06/28/2023    Colorectal cancer screening: Type of screening: Colonoscopy. Completed 05/07/2014. Repeat every 10 years  Lung Cancer Screening: (Low Dose CT Chest recommended if Age 56-80 years, 20 pack-year currently smoking OR have quit w/in 15years.) does qualify.   Lung Cancer Screening Referral: declined 06/29/2023  Additional Screening:  Hepatitis C Screening: does not qualify; Completed 10/03/2017  Vision Screening: Recommended annual ophthalmology exams for early detection of glaucoma and other  disorders of the eye. Is the patient up to date with their annual eye exam?  No  Who is the provider or what is the name of the office in which the patient attends annual eye exams? None referral 06/29/2023 If pt is not established with  a provider, would they like to be referred to a provider to establish care? No .   Dental Screening: Recommended annual dental exams for proper oral hygiene   Community Resource Referral / Chronic Care Management: CRR required this visit?  No   CCM required this visit?  No    Plan:     I have personally reviewed and noted the following in the patient's chart:   Medical and social history Use of alcohol, tobacco or illicit drugs  Current medications and supplements including opioid prescriptions. Patient is not currently taking opioid prescriptions. Functional ability and status Nutritional status Physical activity Advanced directives List of other physicians Hospitalizations, surgeries, and ER visits in previous 12 months Vitals Screenings to include cognitive, depression, and falls Referrals and appointments  In addition, I have reviewed and discussed with patient certain preventive protocols, quality metrics, and best practice recommendations. A written personalized care plan for preventive services as well as general preventive health recommendations were provided to patient.     Lorrene Reid, LPN   12/31/4008   After Visit Summary: (MyChart) Due to this being a telephonic visit, the after visit summary with patients personalized plan was offered to patient via MyChart   Nurse Notes: declines all vaccines

## 2023-07-23 ENCOUNTER — Encounter: Payer: Self-pay | Admitting: Family Medicine

## 2023-07-23 ENCOUNTER — Ambulatory Visit (INDEPENDENT_AMBULATORY_CARE_PROVIDER_SITE_OTHER): Payer: Medicare HMO | Admitting: Family Medicine

## 2023-07-23 VITALS — BP 164/70 | HR 70 | Temp 98.6°F | Ht 68.0 in | Wt 193.6 lb

## 2023-07-23 DIAGNOSIS — R351 Nocturia: Secondary | ICD-10-CM

## 2023-07-23 DIAGNOSIS — K219 Gastro-esophageal reflux disease without esophagitis: Secondary | ICD-10-CM | POA: Diagnosis not present

## 2023-07-23 DIAGNOSIS — R7989 Other specified abnormal findings of blood chemistry: Secondary | ICD-10-CM | POA: Diagnosis not present

## 2023-07-23 DIAGNOSIS — I10 Essential (primary) hypertension: Secondary | ICD-10-CM

## 2023-07-23 DIAGNOSIS — Z0001 Encounter for general adult medical examination with abnormal findings: Secondary | ICD-10-CM

## 2023-07-23 DIAGNOSIS — E559 Vitamin D deficiency, unspecified: Secondary | ICD-10-CM | POA: Diagnosis not present

## 2023-07-23 DIAGNOSIS — F17200 Nicotine dependence, unspecified, uncomplicated: Secondary | ICD-10-CM | POA: Diagnosis not present

## 2023-07-23 DIAGNOSIS — E782 Mixed hyperlipidemia: Secondary | ICD-10-CM | POA: Diagnosis not present

## 2023-07-23 DIAGNOSIS — Z0000 Encounter for general adult medical examination without abnormal findings: Secondary | ICD-10-CM

## 2023-07-23 DIAGNOSIS — K047 Periapical abscess without sinus: Secondary | ICD-10-CM | POA: Diagnosis not present

## 2023-07-23 MED ORDER — OMEPRAZOLE 20 MG PO CPDR
20.0000 mg | DELAYED_RELEASE_CAPSULE | Freq: Every day | ORAL | 3 refills | Status: DC
Start: 2023-07-23 — End: 2024-10-13

## 2023-07-23 MED ORDER — PRAVASTATIN SODIUM 80 MG PO TABS
80.0000 mg | ORAL_TABLET | Freq: Every day | ORAL | 3 refills | Status: DC
Start: 2023-07-23 — End: 2024-09-05

## 2023-07-23 MED ORDER — CHLORTHALIDONE 25 MG PO TABS
25.0000 mg | ORAL_TABLET | Freq: Every day | ORAL | 3 refills | Status: DC
Start: 2023-07-23 — End: 2024-10-13

## 2023-07-23 MED ORDER — AMLODIPINE BESYLATE 10 MG PO TABS: 10.0000 mg | ORAL_TABLET | Freq: Every day | ORAL | 3 refills | Status: DC

## 2023-07-23 MED ORDER — AMOXICILLIN-POT CLAVULANATE 875-125 MG PO TABS
1.0000 | ORAL_TABLET | Freq: Two times a day (BID) | ORAL | 0 refills | Status: DC
Start: 2023-07-23 — End: 2024-10-13

## 2023-07-23 MED ORDER — POTASSIUM CHLORIDE ER 20 MEQ PO TBCR
1.0000 | EXTENDED_RELEASE_TABLET | Freq: Every day | ORAL | 3 refills | Status: DC
Start: 2023-07-23 — End: 2024-10-13

## 2023-07-23 MED ORDER — HYDRALAZINE HCL 50 MG PO TABS
50.0000 mg | ORAL_TABLET | Freq: Three times a day (TID) | ORAL | 3 refills | Status: DC
Start: 2023-07-23 — End: 2024-07-04

## 2023-07-23 NOTE — Progress Notes (Signed)
Daniel Baxter is a 72 y.o. male presents to office today for annual physical exam examination.    Concerns today include: 1. Toothache Has an infected tooth in the right upper mouth. Previously treated with Augmentin back in 2021.  Doesn't see dentist because afraid of needles. No reported fevers, chills, facial swelling  Occupation: retired, Marital status: married, Substance use: tobacco and ETOH (6beers per night) Health Maintenance Due  Topic Date Due   INFLUENZA VACCINE  06/28/2023   Refills needed today: all  Immunization History  Administered Date(s) Administered   Influenza-Unspecified 09/08/2010   Tdap 05/07/2014   Past Medical History:  Diagnosis Date   Hyperlipidemia    Hypertension    Social History   Socioeconomic History   Marital status: Married    Spouse name: Not on file   Number of children: Not on file   Years of education: Not on file   Highest education level: Not on file  Occupational History   Not on file  Tobacco Use   Smoking status: Every Day    Current packs/day: 0.25    Types: Cigarettes   Smokeless tobacco: Never  Vaping Use   Vaping status: Never Used  Substance and Sexual Activity   Alcohol use: No   Drug use: No   Sexual activity: Not Currently  Other Topics Concern   Not on file  Social History Narrative   Not on file   Social Determinants of Health   Financial Resource Strain: Low Risk  (06/29/2023)   Overall Financial Resource Strain (CARDIA)    Difficulty of Paying Living Expenses: Not hard at all  Food Insecurity: No Food Insecurity (06/29/2023)   Hunger Vital Sign    Worried About Running Out of Food in the Last Year: Never true    Ran Out of Food in the Last Year: Never true  Transportation Needs: No Transportation Needs (06/29/2023)   PRAPARE - Administrator, Civil Service (Medical): No    Lack of Transportation (Non-Medical): No  Physical Activity: Inactive (06/29/2023)   Exercise Vital Sign    Days of  Exercise per Week: 0 days    Minutes of Exercise per Session: 0 min  Stress: No Stress Concern Present (06/29/2023)   [employer] of Occupational Health - Occupational Stress Questionnaire    Feeling of Stress : Not at all  Social Connections: Moderately Isolated (06/29/2023)   Social Connection and Isolation Panel [NHANES]    Frequency of Communication with Friends and Family: More than three times a week    Frequency of Social Gatherings with Friends and Family: More than three times a week    Attends Religious Services: Never    Database administrator or Organizations: No    Attends Banker Meetings: Never    Marital Status: Married  Catering manager Violence: Not At Risk (06/29/2023)   Humiliation, Afraid, Rape, and Kick questionnaire    Fear of Current or Ex-Partner: No    Emotionally Abused: No    Physically Abused: No    Sexually Abused: No   History reviewed. No pertinent surgical history. History reviewed. No pertinent family history.  Current Outpatient Medications:    amLODipine (NORVASC) 10 MG tablet, Take 1 tablet (10 mg total) by mouth daily., Disp: 90 tablet, Rfl: 3   chlorthalidone (HYGROTON) 25 MG tablet, Take 1 tablet (25 mg total) by mouth daily., Disp: 90 tablet, Rfl: 3   hydrALAZINE (APRESOLINE) 25 MG tablet, Take 1 tablet (25  mg total) by mouth every 8 (eight) hours., Disp: 270 tablet, Rfl: 3   omeprazole (PRILOSEC) 20 MG capsule, Take 1 capsule (20 mg total) by mouth daily., Disp: 90 capsule, Rfl: 3   Potassium Chloride ER 20 MEQ TBCR, Take 1 tablet (20 mEq total) by mouth daily., Disp: 90 tablet, Rfl: 3   pravastatin (PRAVACHOL) 80 MG tablet, Take 1 tablet (80 mg total) by mouth daily., Disp: 90 tablet, Rfl: 3  Allergies  Allergen Reactions   Lisinopril Anaphylaxis    angioedema     ROS: Review of Systems A comprehensive review of systems was negative except for: Ears, nose, mouth, throat, and face: positive for tooth pain Gastrointestinal:  positive for dyspepsia Genitourinary: positive for nocturia Behavioral/Psych: positive for anxiety    Physical exam BP (!) 164/70   Pulse 70   Temp 98.6 F (37 C)   Ht 5\' 8"  (1.727 m)   Wt 193 lb 9.6 oz (87.8 kg)   SpO2 96%   BMI 29.44 kg/m  General appearance: alert, cooperative, appears stated age, and anxious appearing Head: Normocephalic, without obvious abnormality, atraumatic Eyes: negative findings: lids and lashes normal, conjunctivae and sclerae normal, corneas clear, and pupils equal, round, reactive to light and accomodation Ears: normal TM's and external ear canals both ears Nose: Nares normal. Septum midline. Mucosa normal. No drainage or sinus tenderness. Throat:  oropharynx without exudates. Halitosis present. Poor dentition. No appreciable abscess formation at area of pain. Neck: no adenopathy, supple, symmetrical, trachea midline, and thyroid not enlarged, symmetric, no tenderness/mass/nodules Back: symmetric, no curvature. ROM normal. No CVA tenderness. Lungs:  Globally decreased breath sounds with no appreciable wheezes, rhonchi or rails Chest wall: no tenderness Heart: regular rate and rhythm, S1, S2 normal, no murmur, click, rub or gallop Abdomen:  Protuberant, mildly distended but nontender.  No palpable masses.  Exam is limited by obesity Extremities: extremities normal, atraumatic, no cyanosis or edema Pulses: 2+ and symmetric Skin:  Solar lentigo present Lymph nodes: Cervical, supraclavicular, and axillary nodes normal. Neurologic: Grossly normal      07/23/2023    9:34 AM 06/29/2023    2:34 PM 04/16/2023    3:52 PM  Depression screen PHQ 2/9  Decreased Interest 0 0 0  Down, Depressed, Hopeless 0 0 0  PHQ - 2 Score 0 0 0  Altered sleeping 0  0  Tired, decreased energy 0  0  Change in appetite 0  0  Feeling bad or failure about yourself  0  0  Trouble concentrating 0  0  Moving slowly or fidgety/restless 0  0  Suicidal thoughts 0  0  PHQ-9 Score 0   0  Difficult doing work/chores Not difficult at all  Not difficult at all      07/23/2023    9:35 AM 04/16/2023    3:52 PM 02/20/2022    3:09 PM 01/14/2021    2:42 PM  GAD 7 : Generalized Anxiety Score  Nervous, Anxious, on Edge 0 0 0 0  Control/stop worrying 0 0 0 0  Worry too much - different things 0 0 0 0  Trouble relaxing 0 0 0   Restless 0 0 0 0  Easily annoyed or irritable 0 0 0 0  Afraid - awful might happen 0 0 0 0  Total GAD 7 Score 0 0 0   Anxiety Difficulty Not difficult at all Not difficult at all Not difficult at all      Assessment/ Plan: Daniel Baxter here for  annual physical exam.   Annual physical exam  Primary hypertension - Plan: CMP14+EGFR, amLODipine (NORVASC) 10 MG tablet, chlorthalidone (HYGROTON) 25 MG tablet, Potassium Chloride ER 20 MEQ TBCR, hydrALAZINE (APRESOLINE) 50 MG tablet  Mixed hyperlipidemia - Plan: CMP14+EGFR, pravastatin (PRAVACHOL) 80 MG tablet  Gastroesophageal reflux disease without esophagitis - Plan: CBC, omeprazole (PRILOSEC) 20 MG capsule  Tobacco use disorder - Plan: CBC  Vitamin D deficiency - Plan: VITAMIN D 25 Hydroxy (Vit-D Deficiency, Fractures)  Abnormal TSH - Plan: TSH, T4, Free  Nocturia - Plan: PSA  Dental infection - Plan: amoxicillin-clavulanate (AUGMENTIN) 875-125 MG tablet  Declined vaccination today.  Will schedule with nurse when he can bring his wife with him.  He declined lung cancer screening.  I counseled him on alcohol and tobacco use today  Blood pressure was not controlled.  High advanced his hydralazine to 50 mg 3 times daily.  Continue all other medications as prescribed  Fasting labs collected.  Continue statin.  GERD not controlled due to consumption of pizza last night.  PPI renewed  Check vitamin D given history of deficiency, TSH and free T4 given abnormal TSH last lab draw and PSA given ongoing nocturia.  I offered treatment for BPH but he declined this  Augmentin added for dental infection.   Encouraged him to follow-up with dentistry for tooth removal  Counseled on healthy lifestyle choices, including diet (rich in fruits, vegetables and lean meats and low in salt and simple carbohydrates) and exercise (at least 30 minutes of moderate physical activity daily).  Patient to follow up 1 year for CPE  Daniel Baxter M. Nadine Counts, DO

## 2023-07-24 LAB — CMP14+EGFR
ALT: 29 IU/L (ref 0–44)
AST: 24 IU/L (ref 0–40)
Albumin: 4.3 g/dL (ref 3.9–4.9)
Alkaline Phosphatase: 121 IU/L (ref 44–121)
BUN/Creatinine Ratio: 8 — ABNORMAL LOW (ref 10–24)
BUN: 8 mg/dL (ref 8–27)
Bilirubin Total: 0.4 mg/dL (ref 0.0–1.2)
CO2: 20 mmol/L (ref 20–29)
Calcium: 9.3 mg/dL (ref 8.6–10.2)
Chloride: 102 mmol/L (ref 96–106)
Creatinine, Ser: 1.01 mg/dL (ref 0.76–1.27)
Globulin, Total: 3.2 g/dL (ref 1.5–4.5)
Glucose: 106 mg/dL — ABNORMAL HIGH (ref 70–99)
Potassium: 3.9 mmol/L (ref 3.5–5.2)
Sodium: 135 mmol/L (ref 134–144)
Total Protein: 7.5 g/dL (ref 6.0–8.5)
eGFR: 80 mL/min/{1.73_m2} (ref >59)

## 2023-07-24 LAB — VITAMIN D 25 HYDROXY (VIT D DEFICIENCY, FRACTURES): Vit D, 25-Hydroxy: 64.4 ng/mL (ref 30.0–100.0)

## 2023-07-24 LAB — CBC
Hematocrit: 42.9 % (ref 37.5–51.0)
Hemoglobin: 14.6 g/dL (ref 13.0–17.7)
MCH: 30.5 pg (ref 26.6–33.0)
MCHC: 34 g/dL (ref 31.5–35.7)
MCV: 90 fL (ref 79–97)
Platelets: 241 10*3/uL (ref 150–450)
RBC: 4.78 x10E6/uL (ref 4.14–5.80)
RDW: 14.5 % (ref 11.6–15.4)
WBC: 8.8 10*3/uL (ref 3.4–10.8)

## 2023-07-24 LAB — TSH: TSH: 2.76 u[IU]/mL (ref 0.450–4.500)

## 2023-07-24 LAB — T4, FREE: Free T4: 1.11 ng/dL (ref 0.82–1.77)

## 2023-07-24 LAB — PSA: Prostate Specific Ag, Serum: 0.9 ng/mL (ref 0.0–4.0)

## 2023-10-14 ENCOUNTER — Other Ambulatory Visit: Payer: Self-pay | Admitting: Family Medicine

## 2023-10-14 DIAGNOSIS — I10 Essential (primary) hypertension: Secondary | ICD-10-CM

## 2024-07-04 ENCOUNTER — Other Ambulatory Visit: Payer: Self-pay | Admitting: Family Medicine

## 2024-07-04 DIAGNOSIS — I10 Essential (primary) hypertension: Secondary | ICD-10-CM

## 2024-07-22 ENCOUNTER — Encounter: Payer: Medicare HMO | Admitting: Family Medicine

## 2024-07-23 ENCOUNTER — Encounter: Payer: Self-pay | Admitting: Family Medicine

## 2024-07-26 ENCOUNTER — Other Ambulatory Visit: Payer: Self-pay | Admitting: Family Medicine

## 2024-07-26 DIAGNOSIS — I10 Essential (primary) hypertension: Secondary | ICD-10-CM

## 2024-07-29 ENCOUNTER — Encounter: Payer: Self-pay | Admitting: Family Medicine

## 2024-07-29 NOTE — Telephone Encounter (Signed)
 [facility] pt NTBS 30-d given 07/04/24

## 2024-07-29 NOTE — Telephone Encounter (Signed)
 LMTCB to schedule appt Letter mailed

## 2024-07-30 ENCOUNTER — Other Ambulatory Visit: Payer: Self-pay | Admitting: Family Medicine

## 2024-07-30 DIAGNOSIS — I10 Essential (primary) hypertension: Secondary | ICD-10-CM

## 2024-09-05 ENCOUNTER — Other Ambulatory Visit: Payer: Self-pay | Admitting: Family Medicine

## 2024-09-05 DIAGNOSIS — I10 Essential (primary) hypertension: Secondary | ICD-10-CM

## 2024-09-05 DIAGNOSIS — E782 Mixed hyperlipidemia: Secondary | ICD-10-CM

## 2024-09-05 NOTE — Telephone Encounter (Signed)
 OVERDUE FOR CPE. Must have OV for further fills. 68m supply sent. No more until has OV w/ labs.

## 2024-09-08 ENCOUNTER — Encounter: Payer: Self-pay | Admitting: Family Medicine

## 2024-09-08 NOTE — Telephone Encounter (Signed)
 Letter mailed

## 2024-09-10 ENCOUNTER — Encounter: Payer: Self-pay | Admitting: Family Medicine

## 2024-09-10 ENCOUNTER — Other Ambulatory Visit: Payer: Self-pay | Admitting: Family Medicine

## 2024-09-10 DIAGNOSIS — K219 Gastro-esophageal reflux disease without esophagitis: Secondary | ICD-10-CM

## 2024-09-10 NOTE — Telephone Encounter (Signed)
 LMTCB to schedule appt Letter mailed

## 2024-09-10 NOTE — Telephone Encounter (Signed)
 [facility] NTBS last OV 07/23/23 NO RF sent to pharmacy last OV greater than a year

## 2024-09-12 NOTE — Progress Notes (Addendum)
 Fredrich Cory                                          MRN: [id number]   09/12/2024   The [facility] Quality Team Specialist reviewed this patient medical record for the purposes of chart review for care gap closure. The following were reviewed: chart review for care gap closure-controlling blood pressure and diabetic eye exam.    [facility] Quality Team

## 2024-09-24 ENCOUNTER — Other Ambulatory Visit: Payer: Self-pay

## 2024-09-24 ENCOUNTER — Ambulatory Visit

## 2024-09-24 VITALS — BP 164/70 | HR 70 | Ht 68.0 in | Wt 193.0 lb

## 2024-09-24 DIAGNOSIS — Z0000 Encounter for general adult medical examination without abnormal findings: Secondary | ICD-10-CM

## 2024-09-24 DIAGNOSIS — I10 Essential (primary) hypertension: Secondary | ICD-10-CM

## 2024-09-24 DIAGNOSIS — Z1211 Encounter for screening for malignant neoplasm of colon: Secondary | ICD-10-CM

## 2024-09-24 NOTE — Patient Instructions (Signed)
 Daniel Baxter,  Thank you for taking the time for your Medicare Wellness Visit. I appreciate your continued commitment to your health goals. Please review the care plan we discussed, and feel free to reach out if I can assist you further.  Medicare recommends these wellness visits once per year to help you and your care team stay ahead of potential health issues. These visits are designed to focus on prevention, allowing your provider to concentrate on managing your acute and chronic conditions during your regular appointments.  Please note that Annual Wellness Visits do not include a physical exam. Some assessments may be limited, especially if the visit was conducted virtually. If needed, we may recommend a separate in-person follow-up with your provider.  Ongoing Care Seeing your primary care provider every 3 to 6 months helps us  monitor your health and provide consistent, personalized care.   Referrals If a referral was made during today's visit and you haven't received any updates within two weeks, please contact the referred provider directly to check on the status.  Recommended Screenings:  Health Maintenance  Topic Date Due   Pneumococcal Vaccine for age over 74 (1 of 2 - PCV) Never done   Zoster (Shingles) Vaccine (1 of 2) Never done   DTaP/Tdap/Td vaccine (2 - Td or Tdap) 05/07/2024   Colon Cancer Screening  05/07/2024   Medicare Annual Wellness Visit  06/28/2024   COVID-19 Vaccine (1 - 2025-26 season) Never done   Flu Shot  02/24/2025*   Hepatitis C Screening  Completed   Meningitis B Vaccine  Aged Out  *Topic was postponed. The date shown is not the original due date.       09/24/2024   12:43 PM  Advanced Directives  Does Patient Have a Medical Advance Directive? Yes  Type of Advance Directive Healthcare Power of Attorney  Copy of Healthcare Power of Attorney in Chart? No - copy requested   Advance Care Planning is important because it: Ensures you receive medical care  that aligns with your values, goals, and preferences. Provides guidance to your family and loved ones, reducing the emotional burden of decision-making during critical moments.  Vision: Annual vision screenings are recommended for early detection of glaucoma, cataracts, and diabetic retinopathy. These exams can also reveal signs of chronic conditions such as diabetes and high blood pressure.  Dental: Annual dental screenings help detect early signs of oral cancer, gum disease, and other conditions linked to overall health, including heart disease and diabetes.  Please see the attached documents for additional preventive care recommendations.

## 2024-09-24 NOTE — Progress Notes (Signed)
 Subjective:   Daniel Baxter is a 73 y.o. who presents for a Medicare Wellness preventive visit.  As a reminder, Annual Wellness Visits don't include a physical exam, and some assessments may be limited, especially if this visit is performed virtually. We may recommend an in-person follow-up visit with your provider if needed.  Visit Complete: Virtual I connected with  Daniel Baxter on 09/24/24 by a audio enabled telemedicine application and verified that I am speaking with the correct person using two identifiers.  Patient Location: Home  Provider Location: Office/Clinic  I discussed the limitations of evaluation and management by telemedicine. The patient expressed understanding and agreed to proceed.  Vital Signs: Because this visit was a virtual/telehealth visit, some criteria may be missing or patient reported. Any vitals not documented were not able to be obtained and vitals that have been documented are patient reported.  VideoDeclined- This patient declined Librarian, academic. Therefore the visit was completed with audio only.  Persons Participating in Visit: Patient.  AWV Questionnaire: No: Patient Medicare AWV questionnaire was not completed prior to this visit.  Cardiac Risk Factors include: advanced age (>32men, >71 women);dyslipidemia;hypertension;smoking/ tobacco exposure     Objective:    Today's Vitals   09/24/24 1241  BP: (!) 164/70  Pulse: 70  Weight: 193 lb (87.5 kg)  Height: 5' 8 (1.727 m)   Body mass index is 29.35 kg/m.     09/24/2024   12:43 PM 06/29/2023    2:36 PM  Advanced Directives  Does Patient Have a Medical Advance Directive? Yes No  Type of Scientist, Research (life Sciences) of Healthcare Power of Attorney in Chart? No - copy requested   Would patient like information on creating a medical advance directive?  Yes (MAU/Ambulatory/Procedural Areas - Information given)    Current Medications  (verified) Outpatient Encounter Medications as of 09/24/2024  Medication Sig   amLODipine  (NORVASC ) 10 MG tablet Take 1 tablet (10 mg total) by mouth daily. MUST HAVE OV FOR FURTHER FILLS.   chlorthalidone  (HYGROTON ) 25 MG tablet Take 1 tablet (25 mg total) by mouth daily.   hydrALAZINE  (APRESOLINE ) 50 MG tablet Take 1 tablet (50 mg total) by mouth every 8 (eight) hours. **NEEDS TO BE SEEN BEFORE NEXT REFILL**   omeprazole  (PRILOSEC) 20 MG capsule Take 1 capsule (20 mg total) by mouth daily.   Potassium Chloride  ER 20 MEQ TBCR Take 1 tablet (20 mEq total) by mouth daily.   pravastatin  (PRAVACHOL ) 80 MG tablet Take 1 tablet (80 mg total) by mouth daily. MUST HAVE OV FOR FURTHER FILLS.   amoxicillin -clavulanate (AUGMENTIN ) 875-125 MG tablet Take 1 tablet by mouth 2 (two) times daily. (Patient not taking: Reported on 09/24/2024)   No facility-administered encounter medications on file as of 09/24/2024.    Allergies (verified) Lisinopril    History: Past Medical History:  Diagnosis Date   Hyperlipidemia    Hypertension    No past surgical history on file. No family history on file. Social History   Socioeconomic History   Marital status: Married    Spouse name: Not on file   Number of children: Not on file   Years of education: Not on file   Highest education level: Not on file  Occupational History   Not on file  Tobacco Use   Smoking status: Every Day    Current packs/day: 0.25    Types: Cigarettes   Smokeless tobacco: Never  Vaping Use   Vaping status: Never  Used  Substance and Sexual Activity   Alcohol use: No   Drug use: No   Sexual activity: Not Currently  Other Topics Concern   Not on file  Social History Narrative   Not on file   Social Drivers of Health   Financial Resource Strain: Low Risk  (09/24/2024)   Overall Financial Resource Strain (CARDIA)    Difficulty of Paying Living Expenses: Not hard at all  Food Insecurity: No Food Insecurity (09/24/2024)    Hunger Vital Sign    Worried About Running Out of Food in the Last Year: Never true    Ran Out of Food in the Last Year: Never true  Transportation Needs: No Transportation Needs (09/24/2024)   PRAPARE - Administrator, Civil Service (Medical): No    Lack of Transportation (Non-Medical): No  Physical Activity: Inactive (09/24/2024)   Exercise Vital Sign    Days of Exercise per Week: 0 days    Minutes of Exercise per Session: 0 min  Stress: No Stress Concern Present (09/24/2024)   [employer] of Occupational Health - Occupational Stress Questionnaire    Feeling of Stress: Not at all  Social Connections: Socially Isolated (09/24/2024)   Social Connection and Isolation Panel    Frequency of Communication with Friends and Family: More than three times a week    Frequency of Social Gatherings with Friends and Family: More than three times a week    Attends Religious Services: Never    Database Administrator or Organizations: No    Attends Banker Meetings: Never    Marital Status: Widowed    Tobacco Counseling Ready to quit: No Counseling given: Yes    Clinical Intake:  Pre-visit preparation completed: Yes  Pain : No/denies pain     BMI - recorded: 29.35 Nutritional Status: BMI 25 -29 Overweight Nutritional Risks: None Diabetes: No  Lab Results  Component Value Date   HGBA1C 5.6 10/03/2017     How often do you need to have someone help you when you read instructions, pamphlets, or other written materials from your doctor or pharmacy?: 1 - Never  Interpreter Needed?: No  Information entered by :: alia t/cma   Activities of Daily Living     09/24/2024   12:41 PM  In your present state of health, do you have any difficulty performing the following activities:  Hearing? 1  Vision? 0  Difficulty concentrating or making decisions? 0  Walking or climbing stairs? 0  Dressing or bathing? 0  Doing errands, shopping? 0  Comment pt's son   Preparing Food and eating ? N  Using the Toilet? N  In the past six months, have you accidently leaked urine? N  Do you have problems with loss of bowel control? N  Managing your Medications? N  Managing your Finances? N  Housekeeping or managing your Housekeeping? N    Patient Care Team: Jolinda Norene HERO, DO as PCP - General (Family Medicine)  I have updated your Care Teams any recent Medical Services you may have received from other providers in the past year.     Assessment:   This is a routine wellness examination for Daniel Baxter.  Hearing/Vision screen Hearing Screening - Comments:: Pt have hearing dif Vision Screening - Comments:: Pt denies vision/pt goes to [facility] in [place], /last ov 42yrs, suggest to get an eye exam   Goals Addressed   None    Depression Screen     09/24/2024  12:43 PM 07/23/2023    9:34 AM 06/29/2023    2:34 PM 04/16/2023    3:52 PM 02/20/2022    3:09 PM 01/14/2021    2:41 PM 02/20/2020   11:05 AM  PHQ 2/9 Scores  PHQ - 2 Score 0 0 0 0 0 3 3  PHQ- 9 Score  0  0  6 3    Fall Risk     09/24/2024   12:37 PM 07/23/2023    9:35 AM 06/29/2023    2:33 PM 04/16/2023    3:52 PM 02/20/2022    3:19 PM  Fall Risk   Falls in the past year? 1 0 0 0 0  Number falls in past yr: 0 0 0 0   Injury with Fall? 0 0 0 0   Risk for fall due to : No Fall Risks No Fall Risks No Fall Risks No Fall Risks   Follow up Falls evaluation completed;Education provided Falls evaluation completed Falls prevention discussed Education provided     MEDICARE RISK AT HOME:  Medicare Risk at Home Any stairs in or around the home?: Yes If so, are there any without handrails?: Yes Home free of loose throw rugs in walkways, pet beds, electrical cords, etc?: Yes Adequate lighting in your home to reduce risk of falls?: Yes Life alert?: No Use of a cane, walker or w/c?: Yes Grab bars in the bathroom?: Yes Shower chair or bench in shower?: Yes Elevated toilet seat or a  handicapped toilet?: No  TIMED UP AND GO:  Was the test performed?  no  Cognitive Function: 6CIT completed        06/29/2023    2:36 PM  6CIT Screen  What Year? 0 points  What month? 0 points  What time? 0 points  Count back from 20 0 points  Months in reverse 0 points  Repeat phrase 0 points  Total Score 0 points    Immunizations Immunization History  Administered Date(s) Administered   Influenza-Unspecified 09/08/2010   Tdap 05/07/2014    Screening Tests Health Maintenance  Topic Date Due   Pneumococcal Vaccine: 50+ Years (1 of 2 - PCV) Never done   Zoster Vaccines- Shingrix (1 of 2) Never done   DTaP/Tdap/Td (2 - Td or Tdap) 05/07/2024   Colonoscopy  05/07/2024   COVID-19 Vaccine (1 - 2025-26 season) Never done   Influenza Vaccine  02/24/2025 (Originally 06/27/2024)   Medicare Annual Wellness (AWV)  09/24/2025   Hepatitis C Screening  Completed   Meningococcal B Vaccine  Aged Out    Health Maintenance Items Addressed: Referral sent to GI for colonoscopy  Additional Screening:  Vision Screening: Recommended annual ophthalmology exams for early detection of glaucoma and other disorders of the eye. Is the patient up to date with their annual eye exam?  Yes  Who is the provider or what is the name of the office in which the patient attends annual eye exams? N/a  Dental Screening: Recommended annual dental exams for proper oral hygiene  Community Resource Referral / Chronic Care Management: CRR required this visit?  No   CCM required this visit?  No   Plan:    I have personally reviewed and noted the following in the patient's chart:   Medical and social history Use of alcohol, tobacco or illicit drugs  Current medications and supplements including opioid prescriptions. Patient is not currently taking opioid prescriptions. Functional ability and status Nutritional status Physical activity Advanced directives List of other physicians Hospitalizations,  surgeries, and ER visits in previous 12 months Vitals Screenings to include cognitive, depression, and falls Referrals and appointments  In addition, I have reviewed and discussed with patient certain preventive protocols, quality metrics, and best practice recommendations. A written personalized care plan for preventive services as well as general preventive health recommendations were provided to patient.   Daniel Baxter, CMA   09/24/2024   After Visit Summary: (MyChart) Due to this being a telephonic visit, the after visit summary with patients personalized plan was offered to patient via MyChart   Notes: PCP Follow Up Recommendations: pt is aware and due the following: colonoscopy-ref to GI placed, covid, DTATP, pneumonia, shingles vaccine.

## 2024-09-26 ENCOUNTER — Other Ambulatory Visit: Payer: Self-pay | Admitting: Family Medicine

## 2024-09-26 DIAGNOSIS — I10 Essential (primary) hypertension: Secondary | ICD-10-CM

## 2024-09-26 DIAGNOSIS — K219 Gastro-esophageal reflux disease without esophagitis: Secondary | ICD-10-CM

## 2024-09-26 NOTE — Telephone Encounter (Signed)
 Copied from CRM [id number]. Topic: Clinical - Medication Refill >> Sep 26, 2024  9:56 AM Larissa S wrote: Medication: hydrALAZINE  (APRESOLINE ) 50 MG tablet  Has the patient contacted their pharmacy? Yes (Agent: If no, request that the patient contact the pharmacy for the refill. If patient does not wish to contact the pharmacy document the reason why and proceed with request.) (Agent: If yes, when and what did the pharmacy advise?)  This is the patient's preferred pharmacy:  [id number] - [place],  - [address] [place] [address] Phone: [phone] Fax: [phone]  Is this the correct pharmacy for this prescription? Yes If no, delete pharmacy and type the correct one.   Has the prescription been filled recently? No  Is the patient out of the medication? Yes  Has the patient been seen for an appointment in the last year OR does the patient have an upcoming appointment? No  Can we respond through MyChart? No  Agent: Please be advised that Rx refills may take up to 3 business days. We ask that you follow-up with your pharmacy.

## 2024-10-13 ENCOUNTER — Ambulatory Visit: Payer: Worker's Compensation | Admitting: Family Medicine

## 2024-10-13 ENCOUNTER — Encounter: Payer: Self-pay | Admitting: Family Medicine

## 2024-10-13 VITALS — BP 133/69 | HR 75 | Temp 98.2°F | Ht 68.0 in | Wt 150.1 lb

## 2024-10-13 DIAGNOSIS — Z9181 History of falling: Secondary | ICD-10-CM | POA: Diagnosis not present

## 2024-10-13 DIAGNOSIS — F17200 Nicotine dependence, unspecified, uncomplicated: Secondary | ICD-10-CM

## 2024-10-13 DIAGNOSIS — K219 Gastro-esophageal reflux disease without esophagitis: Secondary | ICD-10-CM | POA: Diagnosis not present

## 2024-10-13 DIAGNOSIS — E559 Vitamin D deficiency, unspecified: Secondary | ICD-10-CM

## 2024-10-13 DIAGNOSIS — R7989 Other specified abnormal findings of blood chemistry: Secondary | ICD-10-CM | POA: Diagnosis not present

## 2024-10-13 DIAGNOSIS — R351 Nocturia: Secondary | ICD-10-CM | POA: Diagnosis not present

## 2024-10-13 DIAGNOSIS — E782 Mixed hyperlipidemia: Secondary | ICD-10-CM | POA: Diagnosis not present

## 2024-10-13 DIAGNOSIS — Z0001 Encounter for general adult medical examination with abnormal findings: Secondary | ICD-10-CM

## 2024-10-13 DIAGNOSIS — R0789 Other chest pain: Secondary | ICD-10-CM

## 2024-10-13 DIAGNOSIS — I10 Essential (primary) hypertension: Secondary | ICD-10-CM

## 2024-10-13 DIAGNOSIS — Z7409 Other reduced mobility: Secondary | ICD-10-CM | POA: Diagnosis not present

## 2024-10-13 DIAGNOSIS — Z0000 Encounter for general adult medical examination without abnormal findings: Secondary | ICD-10-CM

## 2024-10-13 LAB — LIPID PANEL

## 2024-10-13 MED ORDER — PRAVASTATIN SODIUM 80 MG PO TABS: 80.0000 mg | ORAL_TABLET | Freq: Every day | ORAL | 3 refills | Status: AC

## 2024-10-13 MED ORDER — AMLODIPINE BESYLATE 10 MG PO TABS: 10.0000 mg | ORAL_TABLET | Freq: Every day | ORAL | 0 refills | Status: AC

## 2024-10-13 MED ORDER — CHLORTHALIDONE 25 MG PO TABS: 25.0000 mg | ORAL_TABLET | Freq: Every day | ORAL | 3 refills | Status: AC

## 2024-10-13 MED ORDER — HYDRALAZINE HCL 50 MG PO TABS: 50.0000 mg | ORAL_TABLET | Freq: Three times a day (TID) | ORAL | 0 refills | Status: DC

## 2024-10-13 MED ORDER — OMEPRAZOLE 20 MG PO CPDR: 20.0000 mg | DELAYED_RELEASE_CAPSULE | Freq: Every day | ORAL | 3 refills | Status: AC

## 2024-10-13 MED ORDER — POTASSIUM CHLORIDE ER 20 MEQ PO TBCR
1.0000 | EXTENDED_RELEASE_TABLET | Freq: Every day | ORAL | 3 refills | Status: AC
Start: 2024-10-13 — End: ?

## 2024-10-13 NOTE — Patient Instructions (Addendum)
 Overdue for colon cancer screening  Fall Prevention in the Home, Adult Falls can cause injuries and can happen to people of all ages. There are many things you can do to make your home safer and to help prevent falls. What actions can I take to prevent falls? General information Use good lighting in all rooms. Make sure to: Replace any light bulbs that burn out. Turn on the lights in dark areas and use night-lights. Keep items that you use often in easy-to-reach places. Lower the shelves around your home if needed. Move furniture so that there are clear paths around it. Do not use throw rugs or other things on the floor that can make you trip. If any of your floors are uneven, fix them. Add color or contrast paint or tape to clearly mark and help you see: Grab bars or handrails. First and last steps of staircases. Where the edge of each step is. If you use a ladder or stepladder: Make sure that it is fully opened. Do not climb a closed ladder. Make sure the sides of the ladder are locked in place. Have someone hold the ladder while you use it. Know where your pets are as you move through your home. What can I do in the bathroom?     Keep the floor dry. Clean up any water on the floor right away. Remove soap buildup in the bathtub or shower. Buildup makes bathtubs and showers slippery. Use non-skid mats or decals on the floor of the bathtub or shower. Attach bath mats securely with double-sided, non-slip rug tape. If you need to sit down in the shower, use a non-slip stool. Install grab bars by the toilet and in the bathtub and shower. Do not use towel bars as grab bars. What can I do in the bedroom? Make sure that you have a light by your bed that is easy to reach. Do not use any sheets or blankets on your bed that hang to the floor. Have a firm chair or bench with side arms that you can use for support when you get dressed. What can I do in the kitchen? Clean up any spills right  away. If you need to reach something above you, use a step stool with a grab bar. Keep electrical cords out of the way. Do not use floor polish or wax that makes floors slippery. What can I do with my stairs? Do not leave anything on the stairs. Make sure that you have a light switch at the top and the bottom of the stairs. Make sure that there are handrails on both sides of the stairs. Fix handrails that are broken or loose. Install non-slip stair treads on all your stairs if they do not have carpet. Avoid having throw rugs at the top or bottom of the stairs. Choose a carpet that does not hide the edge of the steps on the stairs. Make sure that the carpet is firmly attached to the stairs. Fix carpet that is loose or worn. What can I do on the outside of my home? Use bright outdoor lighting. Fix the edges of walkways and driveways and fix any cracks. Clear paths of anything that can make you trip, such as tools or rocks. Add color or contrast paint or tape to clearly mark and help you see anything that might make you trip as you walk through a door, such as a raised step or threshold. Trim any bushes or trees on paths to your home. Check  to see if handrails are loose or broken and that both sides of all steps have handrails. Install guardrails along the edges of any raised decks and porches. Have leaves, snow, or ice cleared regularly. Use sand, salt, or ice melter on paths if you live where there is ice and snow during the winter. Clean up any spills in your garage right away. This includes grease or oil spills. What other actions can I take? Review your medicines with your doctor. Some medicines can cause dizziness or changes in blood pressure, which increase your risk of falling. Wear shoes that: Have a low heel. Do not wear high heels. Have rubber bottoms and are closed at the toe. Feel good on your feet and fit well. Use tools that help you move around if needed. These  include: Canes. Walkers. Scooters. Crutches. Ask your doctor what else you can do to help prevent falls. This may include seeing a physical therapist to learn to do exercises to move better and get stronger. Where to find more information Centers for Disease Control and Prevention, [employer]: tonerpromos.no [employer] on Aging: baseringtones.pl National Institute on Aging: baseringtones.pl Contact a doctor if: You are afraid of falling at home. You feel weak, drowsy, or dizzy at home. You fall at home. Get help right away if you: Lose consciousness or have trouble moving after a fall. Have a fall that causes a head injury. These symptoms may be an emergency. Get help right away. Call 911. Do not wait to see if the symptoms will go away. Do not drive yourself to the hospital. This information is not intended to replace advice given to you by your health care provider. Make sure you discuss any questions you have with your health care provider. Document Revised: 07/17/2022 Document Reviewed: 07/17/2022 Elsevier Patient Education  2024 [employer].

## 2024-10-13 NOTE — Progress Notes (Signed)
 Daniel Baxter is a 73 y.o. male presents to office today for annual physical exam examination.    Patient is brought to the office by his family member.  She reports that he sustained a fall back in August.  He declined being evaluated in the ER but apparently was down on the ground for at least 17 hours, such that he had significant swelling of his left upper extremity following the fall.  He reports that he slipped off his toilet because the toilet seat was broken and that is how he fell and he was unable to get up.  He has not had any recurrent falls but they did go and buy a rolling walker from the [facility] but it is broken.  Would like to have a prescription for 1, also needing handicap placard form.  He reports that following the fall he had some left-sided chest pain that radiated into his left chest and then it later changed to right sided chest pain that radiated into the right chest.  He continues to smoke about 5 cigarettes/day.  Denies any shortness of breath, hemoptysis or recurrent chest pain.  He is compliant with his PPI and blood pressure medications.  Denies any blood in stool or scrotal concerns.  Drinks 2 beers per night  There are no preventive care reminders to display for this patient.   Immunization History  Administered Date(s) Administered   Influenza-Unspecified 09/08/2010   Tdap 05/07/2014   Past Medical History:  Diagnosis Date   Hyperlipidemia    Hypertension    Social History   Socioeconomic History   Marital status: Married    Spouse name: Not on file   Number of children: Not on file   Years of education: Not on file   Highest education level: Not on file  Occupational History   Not on file  Tobacco Use   Smoking status: Every Day    Current packs/day: 0.25    Types: Cigarettes   Smokeless tobacco: Never  Vaping Use   Vaping status: Never Used  Substance and Sexual Activity   Alcohol use: No   Drug use: No   Sexual activity: Not Currently   Other Topics Concern   Not on file  Social History Narrative   Not on file   Social Drivers of Health   Financial Resource Strain: Low Risk  (09/24/2024)   Overall Financial Resource Strain (CARDIA)    Difficulty of Paying Living Expenses: Not hard at all  Food Insecurity: No Food Insecurity (09/24/2024)   Hunger Vital Sign    Worried About Running Out of Food in the Last Year: Never true    Ran Out of Food in the Last Year: Never true  Transportation Needs: No Transportation Needs (09/24/2024)   PRAPARE - Administrator, Civil Service (Medical): No    Lack of Transportation (Non-Medical): No  Physical Activity: Inactive (09/24/2024)   Exercise Vital Sign    Days of Exercise per Week: 0 days    Minutes of Exercise per Session: 0 min  Stress: No Stress Concern Present (09/24/2024)   [employer] of Occupational Health - Occupational Stress Questionnaire    Feeling of Stress: Not at all  Social Connections: Socially Isolated (09/24/2024)   Social Connection and Isolation Panel    Frequency of Communication with Friends and Family: More than three times a week    Frequency of Social Gatherings with Friends and Family: More than three times a week  Attends Religious Services: Never    Active Member of Clubs or Organizations: No    Attends Banker Meetings: Never    Marital Status: Widowed  Intimate Partner Violence: Not At Risk (09/24/2024)   Humiliation, Afraid, Rape, and Kick questionnaire    Fear of Current or Ex-Partner: No    Emotionally Abused: No    Physically Abused: No    Sexually Abused: No   History reviewed. No pertinent surgical history. History reviewed. No pertinent family history.  Current Outpatient Medications:    amLODipine  (NORVASC ) 10 MG tablet, Take 1 tablet (10 mg total) by mouth daily. MUST HAVE OV FOR FURTHER FILLS., Disp: 90 tablet, Rfl: 0   chlorthalidone  (HYGROTON ) 25 MG tablet, Take 1 tablet (25 mg total) by mouth  daily., Disp: 90 tablet, Rfl: 3   hydrALAZINE  (APRESOLINE ) 50 MG tablet, Take 1 tablet (50 mg total) by mouth every 8 (eight) hours. **NEEDS TO BE SEEN BEFORE NEXT REFILL**, Disp: 90 tablet, Rfl: 0   omeprazole  (PRILOSEC) 20 MG capsule, Take 1 capsule (20 mg total) by mouth daily., Disp: 90 capsule, Rfl: 3   Potassium Chloride  ER 20 MEQ TBCR, Take 1 tablet (20 mEq total) by mouth daily., Disp: 90 tablet, Rfl: 3   pravastatin  (PRAVACHOL ) 80 MG tablet, Take 1 tablet (80 mg total) by mouth daily. MUST HAVE OV FOR FURTHER FILLS., Disp: 90 tablet, Rfl: 3  Allergies  Allergen Reactions   Lisinopril  Anaphylaxis    angioedema     ROS: Review of Systems Pertinent items noted in HPI and remainder of comprehensive ROS otherwise negative.    Physical exam BP 133/69   Pulse 75   Temp 98.2 F (36.8 C)   Ht 5' 8 (1.727 m)   Wt 150 lb 2 oz (68.1 kg)   SpO2 99%   BMI 22.83 kg/m  General appearance: alert, cooperative, appears older than stated age, and no distress Head: Normocephalic, without obvious abnormality, atraumatic Eyes: negative findings: lids and lashes normal, conjunctivae and sclerae normal, corneas clear, and pupils equal, round, reactive to light and accomodation Ears: TMs obscured by cerumen bilaterally Nose: Nares normal. Septum midline. Mucosa normal. No drainage or sinus tenderness. Throat: Poor dentition.  Oropharynx without masses or exudates.  No sublingual masses seen Neck: no adenopathy, supple, symmetrical, trachea midline, and thyroid  not enlarged, symmetric, no tenderness/mass/nodules Back: Increased kyphosis of thoracic spine.  Ambulating with use of a rolling walker.  Gait is antalgic Lungs: clear to auscultation bilaterally Chest wall: no tenderness Heart: regular rate and rhythm, S1, S2 normal, no murmur, click, rub or gallop Abdomen: Soft, nontender and nondistended Extremities: No edema Pulses: 2+ and symmetric Skin: No discrete lesions identified Lymph  nodes: Supraclavicular anterior cervical lymph nodes without enlargement Neurologic: Slightly hard of hearing but otherwise appears to be neurologically intact     09/24/2024   12:43 PM 07/23/2023    9:34 AM 06/29/2023    2:34 PM  Depression screen PHQ 2/9  Decreased Interest 0 0 0  Down, Depressed, Hopeless 0 0 0  PHQ - 2 Score 0 0 0  Altered sleeping  0   Tired, decreased energy  0   Change in appetite  0   Feeling bad or failure about yourself   0   Trouble concentrating  0   Moving slowly or fidgety/restless  0   Suicidal thoughts  0   PHQ-9 Score  0    Difficult doing work/chores  Not difficult at all  Data saved with a previous flowsheet row definition      07/23/2023    9:35 AM 04/16/2023    3:52 PM 02/20/2022    3:09 PM 01/14/2021    2:42 PM  GAD 7 : Generalized Anxiety Score  Nervous, Anxious, on Edge 0 0 0 0  Control/stop worrying 0 0 0 0  Worry too much - different things 0 0 0 0  Trouble relaxing 0 0 0   Restless 0 0 0 0  Easily annoyed or irritable 0 0 0 0  Afraid - awful might happen 0 0 0 0  Total GAD 7 Score 0 0 0   Anxiety Difficulty Not difficult at all Not difficult at all Not difficult at all     No results found for this or any previous visit (from the past 2160 hours).   Assessment/ Plan: Daniel Baxter here for annual physical exam.   Annual physical exam  Atypical chest pain - Plan: EKG 12-Lead  At moderate risk for fall - Plan: Walker rolling  Impaired functional mobility, balance, gait, and endurance - Plan: Walker rolling  Primary hypertension - Plan: CMP14+EGFR, amLODipine  (NORVASC ) 10 MG tablet, chlorthalidone  (HYGROTON ) 25 MG tablet, hydrALAZINE  (APRESOLINE ) 50 MG tablet, Potassium Chloride  ER 20 MEQ TBCR  Mixed hyperlipidemia - Plan: CMP14+EGFR, Lipid Panel, pravastatin  (PRAVACHOL ) 80 MG tablet  Gastroesophageal reflux disease without esophagitis - Plan: CMP14+EGFR, CBC with Differential, omeprazole  (PRILOSEC) 20 MG  capsule  Vitamin D  deficiency - Plan: CMP14+EGFR, VITAMIN D  25 Hydroxy (Vit-D Deficiency, Fractures)  Abnormal TSH - Plan: CMP14+EGFR, TSH + free T4  Nocturia - Plan: CMP14+EGFR, PSA  Tobacco use disorder - Plan: CMP14+EGFR, CBC with Differential   EKG without evidence of ischemia or arrhythmia.  Suspect chest pain was likely positional.  As his fall has been over 3 months ago I did not perform any advanced imaging as I suspect any fractures may have healed by now.  He certainly has some impairment in mobility and gait so I have ordered a rolling walker and given the prescription to them the turn in at their convenience.  Reiterated need for ongoing use of this to promote stability.  He has already had repair of his toilet and given a lifted seat with handles there.  Handicap placard form completed and provided to the patient  His blood pressure is well-controlled and I renewed his medications.  Check renal function, liver enzymes and electrolytes.  Nonfasting lipid panel collected  Check CBC given chronic use of PPI  Check vitamin D  and TSH given history of abnormalities.  He declined vaccination today.  Counseled on healthy lifestyle choices, including diet (rich in fruits, vegetables and lean meats and low in salt and simple carbohydrates) and exercise (at least 30 minutes of moderate physical activity daily).  Patient to follow up 1 year for cpe  Naveya Ellerman M. Jolinda, DO

## 2024-10-14 ENCOUNTER — Ambulatory Visit: Payer: Self-pay | Admitting: Family Medicine

## 2024-10-14 ENCOUNTER — Encounter: Payer: Self-pay | Admitting: Family Medicine

## 2024-10-14 DIAGNOSIS — D649 Anemia, unspecified: Secondary | ICD-10-CM

## 2024-10-14 LAB — CBC WITH DIFFERENTIAL/PLATELET
Basophils Absolute: 0.1 x10E3/uL (ref 0.0–0.2)
Basos: 1 %
EOS (ABSOLUTE): 0.1 x10E3/uL (ref 0.0–0.4)
Eos: 2 %
Hematocrit: 37.2 % — ABNORMAL LOW (ref 37.5–51.0)
Hemoglobin: 11.8 g/dL — ABNORMAL LOW (ref 13.0–17.7)
Immature Grans (Abs): 0 x10E3/uL (ref 0.0–0.1)
Immature Granulocytes: 0 %
Lymphocytes Absolute: 0.8 x10E3/uL (ref 0.7–3.1)
Lymphs: 15 %
MCH: 26.8 pg (ref 26.6–33.0)
MCHC: 31.7 g/dL (ref 31.5–35.7)
MCV: 84 fL (ref 79–97)
Monocytes Absolute: 0.3 x10E3/uL (ref 0.1–0.9)
Monocytes: 6 %
Neutrophils Absolute: 4 x10E3/uL (ref 1.4–7.0)
Neutrophils: 76 %
Platelets: 328 x10E3/uL (ref 150–450)
RBC: 4.41 x10E6/uL (ref 4.14–5.80)
RDW: 16.2 % — ABNORMAL HIGH (ref 11.6–15.4)
WBC: 5.2 x10E3/uL (ref 3.4–10.8)

## 2024-10-14 LAB — CMP14+EGFR
ALT: 12 IU/L (ref 0–44)
AST: 18 IU/L (ref 0–40)
Albumin: 3.2 g/dL — AB (ref 3.8–4.8)
Alkaline Phosphatase: 174 IU/L — AB (ref 47–123)
BUN/Creatinine Ratio: 9 — AB (ref 10–24)
BUN: 7 mg/dL — AB (ref 8–27)
Bilirubin Total: 0.5 mg/dL (ref 0.0–1.2)
CO2: 23 mmol/L (ref 20–29)
Calcium: 8.9 mg/dL (ref 8.6–10.2)
Chloride: 101 mmol/L (ref 96–106)
Creatinine, Ser: 0.76 mg/dL (ref 0.76–1.27)
Globulin, Total: 4 g/dL (ref 1.5–4.5)
Glucose: 96 mg/dL (ref 70–99)
Potassium: 3.8 mmol/L (ref 3.5–5.2)
Sodium: 136 mmol/L (ref 134–144)
Total Protein: 7.2 g/dL (ref 6.0–8.5)
eGFR: 96 mL/min/1.73 (ref >59)

## 2024-10-14 LAB — LIPID PANEL
Cholesterol, Total: 123 mg/dL (ref 100–199)
HDL: 29 mg/dL — AB (ref >39)
LDL CALC COMMENT:: 4.2 ratio (ref 0.0–5.0)
LDL Chol Calc (NIH): 75 mg/dL (ref 0–99)
Triglycerides: 100 mg/dL (ref 0–149)
VLDL Cholesterol Cal: 19 mg/dL (ref 5–40)

## 2024-10-14 LAB — VITAMIN D 25 HYDROXY (VIT D DEFICIENCY, FRACTURES): Vit D, 25-Hydroxy: 60.9 ng/mL (ref 30.0–100.0)

## 2024-10-14 LAB — TSH+FREE T4
Free T4: 1.09 ng/dL (ref 0.82–1.77)
TSH: 4.76 u[IU]/mL — AB (ref 0.450–4.500)

## 2024-10-14 LAB — PSA: Prostate Specific Ag, Serum: 0.4 ng/mL (ref 0.0–4.0)

## 2024-10-14 NOTE — Telephone Encounter (Signed)
Patient aware, appointment scheduled.

## 2024-10-14 NOTE — Telephone Encounter (Signed)
 Left message for patient to call back

## 2024-10-16 ENCOUNTER — Other Ambulatory Visit: Payer: Worker's Compensation

## 2024-10-16 DIAGNOSIS — D649 Anemia, unspecified: Secondary | ICD-10-CM

## 2024-10-17 ENCOUNTER — Ambulatory Visit: Payer: Self-pay | Admitting: Family Medicine

## 2024-10-17 DIAGNOSIS — R634 Abnormal weight loss: Secondary | ICD-10-CM

## 2024-10-17 DIAGNOSIS — K922 Gastrointestinal hemorrhage, unspecified: Secondary | ICD-10-CM

## 2024-10-17 DIAGNOSIS — D649 Anemia, unspecified: Secondary | ICD-10-CM

## 2024-10-17 LAB — ANEMIA PANEL
Ferritin: 167 ng/mL (ref 30–400)
Folate, Hemolysate: 343 ng/mL
Folate, RBC: 988 ng/mL (ref >498)
Hematocrit: 34.7 % — ABNORMAL LOW (ref 37.5–51.0)
Iron Saturation: 12 % — ABNORMAL LOW (ref 15–55)
Iron: 21 ug/dL — ABNORMAL LOW (ref 38–169)
Retic Ct Pct: 1.3 % (ref 0.6–2.6)
Total Iron Binding Capacity: 174 ug/dL — ABNORMAL LOW (ref 250–450)
UIBC: 153 ug/dL (ref 111–343)
Vitamin B-12: 471 pg/mL (ref 232–1245)

## 2024-10-17 MED ORDER — IRON (FERROUS SULFATE) 325 (65 FE) MG PO TABS: 325.0000 mg | ORAL_TABLET | Freq: Every day | ORAL | 3 refills | Status: AC

## 2024-10-20 ENCOUNTER — Other Ambulatory Visit

## 2024-10-20 DIAGNOSIS — D649 Anemia, unspecified: Secondary | ICD-10-CM | POA: Diagnosis not present

## 2024-10-21 LAB — FECAL OCCULT BLOOD, IMMUNOCHEMICAL: Fecal Occult Bld: POSITIVE — AB

## 2024-10-22 NOTE — Telephone Encounter (Signed)
 I reviewed the FOBT result with patient and [name], after obtaining verbal permission from Mr. Daniel Baxter as she is not on his DPR Encouraged him to complete DPR for any persons he desires to have access to his medical issues I am highly concerned about a colon malignancy given anemia that appears to be secondary to rectal bleeding and 40 pound weight loss between CPE last year and this year. He apparently has had a colonoscopy back in the 90s at [facility] but after having done the cleanout and feeling like a light pole was put up his butt he has adamantly declined screening since that time.  He at that time apparently had some type of bleeding ulcer.  He has had a longstanding history of alcohol use of at least 2 beers per day and we discussed that this puts him at increased risk of varices and other types of GI bleeds.  I implored him to reconsider evaluation with endoscopy and colonoscopy as I am very suspicious that he may have some type of colon malignancy based on weight loss and rectal bleeding.  He is amenable and will come back in the next week on Thursday for repeat CBC so that we can trend his hemoglobin.  In the meantime, continue iron  as directed

## 2024-10-30 ENCOUNTER — Other Ambulatory Visit

## 2024-11-08 ENCOUNTER — Other Ambulatory Visit: Payer: Self-pay | Admitting: Family Medicine

## 2024-11-08 DIAGNOSIS — I10 Essential (primary) hypertension: Secondary | ICD-10-CM

## 2024-11-19 ENCOUNTER — Encounter (INDEPENDENT_AMBULATORY_CARE_PROVIDER_SITE_OTHER): Payer: Self-pay | Admitting: *Deleted

## 2024-11-24 ENCOUNTER — Telehealth: Payer: Self-pay | Admitting: Family Medicine

## 2024-11-24 NOTE — Telephone Encounter (Unsigned)
 Copied from CRM [id number]. Topic: Clinical - Order For Equipment >> Nov 24, 2024 12:03 PM Farrel B wrote: Reason for CRM: Alesha from [employer] called to check status of fax that was received in regard to pt obtaining incontinence supplies please call [employer] at [phone]

## 2024-12-31 ENCOUNTER — Encounter: Payer: Self-pay | Admitting: *Deleted

## 2024-12-31 NOTE — Progress Notes (Signed)
 Daniel Baxter                                          MRN: [id number]   12/31/2024   The [facility] Quality Team Specialist reviewed this patient medical record for the purposes of chart review for care gap closure. The following were reviewed: abstraction for care gap closure-controlling blood pressure.    [facility] Quality Team

## 2025-10-14 ENCOUNTER — Encounter: Payer: Worker's Compensation | Admitting: Family Medicine
